# Patient Record
Sex: Female | Born: 1960 | Race: White | Hispanic: No | Marital: Married | Smoking: Never smoker
Health system: Southern US, Community
[De-identification: ages and names within clinical notes are randomized; demographics above are authoritative.]

## PROBLEM LIST (undated history)

## (undated) DIAGNOSIS — Z9889 Other specified postprocedural states: Secondary | ICD-10-CM

## (undated) DIAGNOSIS — I341 Nonrheumatic mitral (valve) prolapse: Secondary | ICD-10-CM

## (undated) DIAGNOSIS — R112 Nausea with vomiting, unspecified: Secondary | ICD-10-CM

## (undated) DIAGNOSIS — K449 Diaphragmatic hernia without obstruction or gangrene: Secondary | ICD-10-CM

## (undated) DIAGNOSIS — R002 Palpitations: Secondary | ICD-10-CM

## (undated) DIAGNOSIS — R079 Chest pain, unspecified: Secondary | ICD-10-CM

## (undated) HISTORY — DX: Diaphragmatic hernia without obstruction or gangrene: K44.9

## (undated) HISTORY — PX: WISDOM TOOTH EXTRACTION: SHX21

## (undated) HISTORY — DX: Palpitations: R00.2

## (undated) HISTORY — PX: OTHER SURGICAL HISTORY: SHX169

## (undated) HISTORY — DX: Chest pain, unspecified: R07.9

---

## 1995-09-23 HISTORY — PX: DIAGNOSTIC LAPAROSCOPY: SUR761

## 1998-06-11 ENCOUNTER — Inpatient Hospital Stay (HOSPITAL_COMMUNITY): Admission: AD | Admit: 1998-06-11 | Discharge: 1998-06-13 | Payer: Self-pay | Admitting: Obstetrics and Gynecology

## 1998-06-15 ENCOUNTER — Inpatient Hospital Stay (HOSPITAL_COMMUNITY): Admission: AD | Admit: 1998-06-15 | Discharge: 1998-06-15 | Payer: Self-pay | Admitting: Obstetrics and Gynecology

## 1998-06-17 ENCOUNTER — Inpatient Hospital Stay (HOSPITAL_COMMUNITY): Admission: AD | Admit: 1998-06-17 | Discharge: 1998-07-03 | Payer: Self-pay | Admitting: *Deleted

## 1998-10-22 ENCOUNTER — Ambulatory Visit (HOSPITAL_COMMUNITY): Admission: RE | Admit: 1998-10-22 | Discharge: 1998-10-22 | Payer: Self-pay | Admitting: Gastroenterology

## 1999-10-15 ENCOUNTER — Ambulatory Visit (HOSPITAL_COMMUNITY): Admission: RE | Admit: 1999-10-15 | Discharge: 1999-10-15 | Payer: Self-pay

## 1999-10-15 ENCOUNTER — Encounter (INDEPENDENT_AMBULATORY_CARE_PROVIDER_SITE_OTHER): Payer: Self-pay

## 2001-07-29 ENCOUNTER — Other Ambulatory Visit: Admission: RE | Admit: 2001-07-29 | Discharge: 2001-07-29 | Payer: Self-pay | Admitting: Obstetrics and Gynecology

## 2002-08-24 ENCOUNTER — Other Ambulatory Visit: Admission: RE | Admit: 2002-08-24 | Discharge: 2002-08-24 | Payer: Self-pay | Admitting: Obstetrics and Gynecology

## 2003-09-06 ENCOUNTER — Other Ambulatory Visit: Admission: RE | Admit: 2003-09-06 | Discharge: 2003-09-06 | Payer: Self-pay | Admitting: Obstetrics and Gynecology

## 2004-07-22 ENCOUNTER — Ambulatory Visit (HOSPITAL_COMMUNITY): Admission: RE | Admit: 2004-07-22 | Discharge: 2004-07-22 | Payer: Self-pay | Admitting: Gastroenterology

## 2004-09-27 ENCOUNTER — Other Ambulatory Visit: Admission: RE | Admit: 2004-09-27 | Discharge: 2004-09-27 | Payer: Self-pay | Admitting: Obstetrics and Gynecology

## 2005-11-14 ENCOUNTER — Other Ambulatory Visit: Admission: RE | Admit: 2005-11-14 | Discharge: 2005-11-14 | Payer: Self-pay | Admitting: Obstetrics and Gynecology

## 2006-01-21 ENCOUNTER — Encounter: Payer: Self-pay | Admitting: Obstetrics & Gynecology

## 2007-09-23 HISTORY — PX: TUBAL LIGATION: SHX77

## 2007-09-23 HISTORY — PX: DILATION AND CURETTAGE OF UTERUS: SHX78

## 2008-04-04 ENCOUNTER — Encounter (INDEPENDENT_AMBULATORY_CARE_PROVIDER_SITE_OTHER): Payer: Self-pay | Admitting: Obstetrics and Gynecology

## 2008-04-04 ENCOUNTER — Ambulatory Visit (HOSPITAL_COMMUNITY): Admission: RE | Admit: 2008-04-04 | Discharge: 2008-04-04 | Payer: Self-pay | Admitting: Obstetrics and Gynecology

## 2010-04-02 ENCOUNTER — Encounter: Admission: RE | Admit: 2010-04-02 | Discharge: 2010-04-02 | Payer: Self-pay | Admitting: Otolaryngology

## 2011-02-04 NOTE — Op Note (Signed)
NAMEDESTANEY, SARKIS NO.:  [id number]   MEDICAL RECORD NO.:  [id number]          PATIENT TYPE:  AMB   LOCATION:  SDC                           FACILITY:  [facility]   PHYSICIAN:  Juluis Mire, M.D.   DATE OF BIRTH:  1960/12/11   DATE OF PROCEDURE:  DATE OF DISCHARGE:                               OPERATIVE REPORT   PREOPERATIVE DIAGNOSES:  Endometrial polyp.  Desires sterility.   POSTOPERATIVE DIAGNOSES:  Endometrial polyp.  Desires sterility.   OPERATIVE PROCEDURE:  Paracervical block, cervical dilation,  hysteroscopy, resection of polyp, as well as endometrial biopsies and  curettings, and diagnostic laparoscopy with bilateral tubal fulguration.   SURGEON:  Juluis Mire, MD   ANESTHESIA:  General endotracheal.   ESTIMATED BLOOD LOSS:  Minimal.   PACKS AND DRAINS:  None.   INTRAOPERATIVE BLOOD PLACED:  None.   COMPLICATIONS:  None.   INDICATIONS:  In dictated history and physical.   PROCEDURE:  The patient was taken to the OR, placed supine position.  After satisfactory level of general endotracheal anesthesia was  obtained, the patient was placed in the dorsal lithotomy position using  the Allen stirrups.  The abdomen, perineum, and vagina prepped out with  Betadine.  Bladder was in-and-out catheterization.  The patient was then  draped out for hysteroscopy.  A speculum was placed in the vaginal  vault.  Cervix was grasped Christella Hartigan tenaculum.  Uterus sounded to  approximately 9 cm.  A paracervical block of 1% Nesacaine was  instituted.  Cervix serially dilated with size 29 Pratt dilator.  Operative hysteroscope was then introduced.  Intrauterine cavity was  distended using sorbitol.  She did have a polyp near the right tubal  ostium.  It was resected and sent for pathology.  The endometrium was  otherwise atrophic.  It looked she had a posterior wall fibroid that was  slightly submucosal.  Biopsies were obtained of the endometrial  curettings.  Total  deficit was 150 mL.  There were no signs of  perforation or active bleeding.  At this point in time, hysteroscopy was  discontinued.  The single-tooth tenaculum and speculum were then  removed.  A Hulka sac was put in place.  Bladder was again emptied out  by catheterization.   At this point time, a subumbilical incision was made with a knife.  The  incision was extended through subcutaneous tissue.  Fascia was  identified, entered sharply, incision fashioned laterally.  Muscles were  separated.  The peritoneum was elevated entered sharply.  An open  laparoscopic trocar was put in place and secured.  Abdomen was inflated  with carbon dioxide.  Laparoscope was introduced.  The patient was  placed in the Trendelenburg position.  Visualization revealed a  posterior wall uterine fibroid measuring approximately 5 cm.  Tubes and  ovaries were unremarkable.  Mid segment of each tube was identified and  cauterized for a distance of 2-3 cm.  Coagulation was continued until  resistance read 0.  We then coagulated the same segment of tube  completely desiccating the tube.  Coagulation did extend  out to  mesosalpinx.  At this point on time, both tubes appeared to have been  adequately coagulated.  Again, ovaries were unremarkable.  Appendix was  visualized and noted to be clear.  Upper abdomen including liver and tip  of the gallbladder were unremarkable.  Abdomen was deflated with carbon  dioxide.  Trocar was removed.  Subumbilical fascia closed with figure-of-  eight of 0 Vicryl.  Skin with interrupted 4-0 Vicryl.  The Hulka tacked  was then taken out.  The patient taken out of dorsal position.  Once  alert and extubated, transferred to recovery room in good condition.  Sponge, instrument, and needle count was correct by circulating nurse.      Juluis Mire, M.D.  Electronically Signed     JSM/MEDQ  D:  04/04/2008  T:  04/05/2008  Job:  [id number]

## 2011-02-04 NOTE — H&P (Signed)
[name], [name] NO.:  [id number]   MEDICAL RECORD NO.:  [id number]          PATIENT TYPE:  AMB   LOCATION:  SDC                           [facility]ACILITY:  [facility]   PHYSICIAN:  Juluis Mire, M.D.   DATE OF BIRTH:  02-12-1961   DATE OF ADMISSION:  04/04/2008  DATE OF DISCHARGE:                              HISTORY & PHYSICAL   The patient is 50 year old gravida 2 para 0-1-1-0 female who presents  for hysteroscopy as well as laparoscopic bilateral tubal ligation.  The  patient has a longstanding history of infertility.  She did lose twins  during the second trimester, did have a D&C.  She has had one previous  diagnostic laparoscopy performed for endometriosis.  We did a saline  infusion ultrasound with an abnormal bleeding.  She had an evidence of  an intrauterine fibroid as well as an endometrial polyp.  We are going  to resect the polyp.  She desires a permanent sterilization at this  point in time.  We discussed 2 options, one will be the Esher versus the  laparoscopic tubal.  The patient is desirous of having a laparoscopic  tubal done at this point in time.  She does understand the potential  reversibility of sterilization, failure rate of 1 in 200, failures can  be in the form of ectopic pregnancy requiring further surgical  management.   ALLERGIES:  The patient has no known drug allergies.   MEDICATIONS:  None.   PAST MEDICAL HISTORY:  Usual childhood diseases, longstanding history of  infertility, conceived with IVF, and previous laparoscopy in 1997.   FAMILY HISTORY:  Noncontributory.   SOCIAL HISTORY:  No tobacco or alcohol use.   REVIEW OF SYSTEMS:  Noncontributory.   OBJECTIVE:  GENERAL:  The patient is afebrile with stable vital signs.  HEENT:  Normocephalic.  Pupils equal, round, and reactive to light and  accommodation.  Extraocular movements were intact.  Sclerae and  conjunctiva were clear.  Oropharynx clear.  NECK:  Without thyromegaly.  BREASTS:  No discrete masses.  LUNGS:  Clear.  CARDIOVASCULAR:  Regular rhythm without murmurs or gallops.  ABDOMEN:  Benign.  No mass, splenomegaly or tenderness.  PELVIC:  Normal external genitalia.  Vaginal mucosa is clear.  Cervix  unremarkable.  Uterus normal size, shape, and contour.  Adnexa free of  mass or tenderness.  EXTREMITIES:  Trace edema.  NEURO:  Grossly within normal limits.   IMPRESSION:  1. Desires sterility.  2. Abnormal bleed with endometrial polyp.   PLAN:  The patient will undergo hysteroscopic resection of polyp,  subsequent laparoscopic tubal, the risks of surgery have been discussed  including the risk of infection.  The risk of hemorrhage that could  require transfusion with the risk of AIDS or hepatitis.  The risk of  injury to adjacent organs including bladder, bowel, ureters that could  require further exploratory surgery.  Risk of deep venous thrombosis and  pulmonary emboli.  The patient expressed understanding of the  indications and potential risk.      Juluis Mire, M.D.  Electronically Signed  JSM/MEDQ  D:  04/04/2008  T:  04/04/2008  Job:  [id number]

## 2011-02-07 NOTE — Op Note (Signed)
Christine [name], Christine [name] NO.:  [id number]   MEDICAL RECORD NO.:  [id number]          PATIENT TYPE:  AMB   LOCATION:  ENDO                         FACILITY:  [facility]   PHYSICIAN:  Bernette Redbird, M.D.   DATE OF BIRTH:  25-Mar-1961   DATE OF PROCEDURE:  07/22/2004  DATE OF DISCHARGE:                                 OPERATIVE REPORT   PROCEDURE:  Colonoscopy.   INDICATION:  Family history of colon cancer in her father with a negative  screening colonoscopy by me about four-and-a-half years ago.   FINDINGS:  Normal exam to the terminal ileum.   DESCRIPTION OF PROCEDURE:  The nature, purpose and risks of the procedure  were familiar to the patient from prior examinations and she provided  written consent.  Sedation was fentanyl 85 mcg and Versed 8.5 mg IV without  arrhythmias or desaturation.  The Olympus adjustable tension pediatric video  colonoscope was advanced without significant difficulty to the terminal  ileum, which had a normal appearance and pullback was then performed.  The  quality of the prep was excellent and I felt that all areas were well seen.   This was a normal examination.  No polyps, cancer, colitis, vascular  malformations or diverticular disease were observed.  Retroflexion in the  rectum was attempted, but somehow did not visualize the distal rectum,  apparently because the tip of the scope was getting behind folds.  Antegrade  viewing and reinspection of the rectum, however, disclosed no abnormalities.   The patient tolerated the procedure well and there were no apparent  complications.  No biopsies were obtained.   IMPRESSION:  Family history of colon cancer with normal current screening  examination.   PLAN:  Followup colonoscopy in five years.       RB/MEDQ  D:  07/22/2004  T:  07/22/2004  Job:  [id number]   cc:   Gwen Pounds, MD  Fax: [phone]

## 2011-02-07 NOTE — Op Note (Signed)
[facility]  Patient:    Christine [name]                         MRN: [id number] Proc. Date: 10/15/99 Adm. Date:  [id number] Attending:  Frederich Balding                           Operative Report  PREOPERATIVE DIAGNOSIS:       Nonviable first trimester pregnancy.  POSTOPERATIVE DIAGNOSIS:      Nonviable first trimester pregnancy.  OPERATION:                    Dilatation and evacuation.  SURGEON:                      Juluis Mire, M.D.  ANESTHESIA:                   Sedation with paracervical block.  ESTIMATED BLOOD LOSS:         Minimal.  _________ were 95.  BLOOD REPLACED:               None.  COMPLICATIONS:                None.  INDICATIONS:                  Dictated history and physical.  DESCRIPTION OF PROCEDURE:     Patient was taken to the OR and placed in the supine position.   After satisfactory level of sedation patient was placed in the dorsal lithotomy position, now in stirrups.  Speculum was placed in the vaginal vault.  Cervix was cleansed out with Betadine.  Paracervical block was entered using 1%  Xylocaine.  Cervix was _________ with single tooth tenaculum.  The uterus was found at approximately 10 cm.  The cervix was easily dilated to a size 27 Pratt  dilator.  A size 8 curved suction curet was introduced and contents were removed using suction. This was continued until no additional tissue was obtained.  The  uterus was contracting down well.  Sharp curetting revealed all quadrants to be  clear with no additional tissue.  Repeat suction curetting revealed the uterus o be contracting nicely with no additional tissue.  The speculum and single tooth  tenaculum were removed.  The patient was taken out of the dorsal lithotomy position, was alert, and was transferred to the recovery room in good condition. Sponge, instrument, and needle count were correct.  _________ . DD:  10/15/99 TD:  10/15/99 Job:  [id number]

## 2011-02-07 NOTE — H&P (Signed)
[facility]  PatientALLINA [name]                         MRN: [id number] Adm. Date:  [id number] Disc. Date: [id number] Attending:  Frederich Balding                         History and Physical  HISTORY OF PRESENT ILLNESS:   Patient is a 50 year old gravida 2, para 0-1-0-0,  married white female who presents for a D&E for a nonviable first trimester of pregnancy.  The patient did conceive with IVF.  She has been followed at [facility] since the conception.  It is evident by their scans that this is a nonviable pregnancy. he presented to the office yesterday to repeat ultrasound; there was a gestational sac consistent with 7 weeks and 4 days.  No fetal pole or heart activity were noted. In view of these findings, the patient presents now at the present time to undergo D&E for nonviable first trimester of pregnancy.  ALLERGIES:                    No known drug allergies.  MEDICATIONS:                  Vitamins.  PAST MEDICAL HISTORY:         Usual childhood diseases; no significant sequelae.  PREVIOUS SURGICAL HISTORY:    Diagnostic laparoscopy in February 1997 with finding of minimal pelvic endometriosis.  She does have a questionable history of mitral valve prolapse.  FAMILY HISTORY:               Noncontributory.  SOCIAL HISTORY:               No tobacco or alcohol use.  REVIEW OF SYSTEMS:            Noncontributory.  PHYSICAL EXAMINATION:         Patient is afebrile with stable vital signs.  HEENT:                        Patient is normocephalic.  Pupils are equal, round and reactive to light and accommodation.  Extraocular movements are intact. Sclerae and conjunctivae clear.  Oropharynx clear.  NECK:                         Without thyromegaly.  BREASTS:                      No discrete masses.  LUNGS:                        Clear.  CARDIAC:                      Regular rhythm and rate.  Without murmurs  or gallops.  ABDOMEN:                      Exam is benign.  PELVIC:                       Normal external genitalia.  Vaginal mucosa is clear. Cervix unremarkable.  Uterus feels to be upper limits of normal size.  Adnexa unremarkable.  EXTREMITIES:  Trace edema.  NEUROLOGIC:                   Grossly within normal limits.  IMPRESSION:                   Nonviable first trimester of pregnancy.  PLAN:                         The patient will undergo dilatation and evacuation. The risks have been discussed including anesthetic concerns, the risk of infection, the risk of continued bleeding or hemorrhage that could require transfusion with the risks of AIDS or hepatitis, and excessive bleeding could require hysterectomy, there is a risk of uterine perforation that could require diagnostic laparoscopy and exploratory laparotomy for management and the risks of deep venous thrombosis and pulmonary embolus.  Patient expressed understanding of indications and risks. Patients blood type is O-positive.  RhoGAM will not be required. DD:  10/15/99 TD:  10/15/99 Job: [id number]

## 2011-06-19 LAB — CBC
HCT: 33.4 — ABNORMAL LOW
Hemoglobin: 11.3 — ABNORMAL LOW
MCHC: 34
MCV: 87.8
Platelets: 206
RBC: 3.8 — ABNORMAL LOW
RDW: 14.6
WBC: 5.7

## 2011-07-03 ENCOUNTER — Other Ambulatory Visit (HOSPITAL_COMMUNITY): Payer: Self-pay | Admitting: Obstetrics and Gynecology

## 2011-07-04 ENCOUNTER — Ambulatory Visit (HOSPITAL_COMMUNITY)
Admission: RE | Admit: 2011-07-04 | Discharge: 2011-07-04 | Disposition: A | Discharge status: Home / Self Care | Payer: BC Managed Care – PPO | Source: Ambulatory Visit | Attending: Obstetrics and Gynecology | Admitting: Obstetrics and Gynecology

## 2011-07-04 DIAGNOSIS — K7689 Other specified diseases of liver: Secondary | ICD-10-CM | POA: Insufficient documentation

## 2011-07-04 DIAGNOSIS — R109 Unspecified abdominal pain: Secondary | ICD-10-CM | POA: Insufficient documentation

## 2011-11-19 ENCOUNTER — Other Ambulatory Visit (HOSPITAL_COMMUNITY): Payer: Self-pay | Admitting: Obstetrics and Gynecology

## 2011-11-19 DIAGNOSIS — K7689 Other specified diseases of liver: Secondary | ICD-10-CM

## 2011-11-24 ENCOUNTER — Other Ambulatory Visit: Payer: Self-pay | Admitting: Dermatology

## 2012-01-05 ENCOUNTER — Ambulatory Visit (HOSPITAL_COMMUNITY)
Admission: RE | Admit: 2012-01-05 | Discharge: 2012-01-05 | Disposition: A | Discharge status: Home / Self Care | Payer: BC Managed Care – PPO | Source: Ambulatory Visit | Attending: Obstetrics and Gynecology | Admitting: Obstetrics and Gynecology

## 2012-01-05 DIAGNOSIS — K7689 Other specified diseases of liver: Secondary | ICD-10-CM | POA: Insufficient documentation

## 2012-01-07 ENCOUNTER — Ambulatory Visit (HOSPITAL_COMMUNITY): Payer: BC Managed Care – PPO

## 2013-01-27 ENCOUNTER — Encounter (HOSPITAL_COMMUNITY): Payer: Self-pay | Admitting: *Deleted

## 2013-01-28 ENCOUNTER — Encounter (HOSPITAL_COMMUNITY): Payer: Self-pay

## 2013-02-03 NOTE — H&P (Signed)
  Patient name  Christine [name], Christine [name] DICTATION#  [id number] CSN# [id number]  [facility], MD 02/03/2013 10:21 AM

## 2013-02-04 NOTE — H&P (Signed)
NAMECHANDLAR, [name] NO.:  [id number]  MEDICAL RECORD NO.:  [id number]  LOCATION:                                 FACILITY:  PHYSICIAN:  Juluis Mire, M.D.        DATE OF BIRTH:  DATE OF ADMISSION: DATE OF DISCHARGE:                             HISTORY & PHYSICAL   HISTORY OF PRESENT ILLNESS:  The patient is a 52 year old gravida 2, para 0, abortus 2 female, who presents for hysteroscopy with D and C.  In relation to present admission, she was having abnormal uterine bleeding and underwent saline infusion ultrasound revealed 2 endometrial polyps and now presents for hysteroscopic resection.  ALLERGIES:  No known drug allergies.  MEDICATIONS:  None at the present time.  PAST MEDICAL HISTORY:  She has had usual childhood diseases without any significant sequelae.  She has a history of chronic tinnitus.  This has been followed by an Ear, Nose, and Throat doctor.  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  PAST SURGICAL HISTORY:  She has had D and C.  In 1999, she conceived with twins, lost 1 twin and subsequently had previable delivery the 2nd twin.  In 1997, she had laparoscopic evaluation with treatment of endometriosis.  In 2007, she had hysteroscopy with removal of the polyp and bilateral tubal ligation and was found to have fibroids at that time.  She does have 1 adopted child.  SOCIAL HISTORY:  Reveals no tobacco or alcohol use.  FAMILY HISTORY:  Noncontributory.  REVIEW OF SYSTEMS:  Noncontributory.  PHYSICAL EXAMINATION:  VITAL SIGNS:  The patient is afebrile.  Stable vital signs. HEENT:  The patient is normocephalic.  Pupils equal, round, and reactive to light and accommodation.  Extraocular movements were intact.  Sclerae and conjunctivae are clear.  Oropharynx clear. NECK:  Without thyromegaly. BREASTS:  No discrete masses. LUNGS:  Clear. CARDIOVASCULAR:  Regular rhythm rate without murmurs or gallops.  There was no  carotid or abdominal bruits. ABDOMEN:  Benign.  No mass, organomegaly, or tenderness. PELVIC:  Normal external genitalia.  Vaginal mucosa is clear.  Cervix unremarkable.  Uterus, upper limits of normal size, irregular consistent with known fibroids.  Adnexa unremarkable. EXTREMITIES:  Trace edema. NEUROLOGIC:  Gross normal limits.  IMPRESSION: 1. Perimenopausal, abnormal bleeding with endometrial polyps. 2. Uterine fibroids.  PLAN:  The patient to undergo hysteroscopy with D and C.  The nature of procedure have been discussed.  The risk have been explained.  The risk would include the risk of infection.  The risk of hemorrhage that could require transfusion with the risk of AIDS or hepatitis.  Excessive bleeding could require hysterectomy.  Risk of perforation with injury to adjacent organs requiring further exploratory surgery.  Risk of deep venous thrombosis and pulmonary embolus.  The possibility of recurrent polyps have been discussed.     Juluis Mire, M.D.     JSM/MEDQ  D:  02/03/2013  T:  02/04/2013  Job:  [id number]

## 2013-02-10 ENCOUNTER — Ambulatory Visit (HOSPITAL_COMMUNITY)
Admission: RE | Admit: 2013-02-10 | Discharge: 2013-02-10 | Disposition: A | Discharge status: Home / Self Care | Payer: PRIVATE HEALTH INSURANCE | Source: Ambulatory Visit | Attending: Obstetrics and Gynecology | Admitting: Obstetrics and Gynecology

## 2013-02-10 ENCOUNTER — Ambulatory Visit (HOSPITAL_COMMUNITY): Payer: PRIVATE HEALTH INSURANCE | Admitting: Anesthesiology

## 2013-02-10 ENCOUNTER — Encounter (HOSPITAL_COMMUNITY): Payer: Self-pay | Admitting: Anesthesiology

## 2013-02-10 ENCOUNTER — Encounter (HOSPITAL_COMMUNITY)
Admission: RE | Disposition: A | Discharge status: Home / Self Care | Payer: Self-pay | Source: Ambulatory Visit | Attending: Obstetrics and Gynecology

## 2013-02-10 DIAGNOSIS — N841 Polyp of cervix uteri: Secondary | ICD-10-CM | POA: Insufficient documentation

## 2013-02-10 DIAGNOSIS — N840 Polyp of corpus uteri: Secondary | ICD-10-CM | POA: Diagnosis present

## 2013-02-10 DIAGNOSIS — N949 Unspecified condition associated with female genital organs and menstrual cycle: Secondary | ICD-10-CM | POA: Insufficient documentation

## 2013-02-10 DIAGNOSIS — N938 Other specified abnormal uterine and vaginal bleeding: Secondary | ICD-10-CM | POA: Insufficient documentation

## 2013-02-10 HISTORY — DX: Nausea with vomiting, unspecified: R11.2

## 2013-02-10 HISTORY — PX: DILATATION & CURRETTAGE/HYSTEROSCOPY WITH RESECTOCOPE: SHX5572

## 2013-02-10 HISTORY — DX: Other specified postprocedural states: Z98.890

## 2013-02-10 HISTORY — DX: Nonrheumatic mitral (valve) prolapse: I34.1

## 2013-02-10 LAB — CBC
HCT: 41.1 % (ref 36.0–46.0)
MCH: 30.5 pg (ref 26.0–34.0)
MCHC: 33.8 g/dL (ref 30.0–36.0)
MCV: 90.3 fL (ref 78.0–100.0)
RDW: 13.1 % (ref 11.5–15.5)

## 2013-02-10 SURGERY — DILATATION & CURETTAGE/HYSTEROSCOPY WITH RESECTOCOPE
Anesthesia: General | Site: Vagina | Wound class: Clean Contaminated

## 2013-02-10 MED ORDER — ONDANSETRON HCL 4 MG/2ML IJ SOLN
INTRAMUSCULAR | Status: AC
  Filled 2013-02-10: qty 2

## 2013-02-10 MED ORDER — PROMETHAZINE HCL 25 MG/ML IJ SOLN: 6.2500 mg | INTRAMUSCULAR | Status: DC | PRN

## 2013-02-10 MED ORDER — SCOPOLAMINE 1 MG/3DAYS TD PT72: 1.0000 | MEDICATED_PATCH | TRANSDERMAL | Status: DC

## 2013-02-10 MED ORDER — KETOROLAC TROMETHAMINE 30 MG/ML IJ SOLN: 15.0000 mg | Freq: Once | INTRAMUSCULAR | Status: DC | PRN

## 2013-02-10 MED ORDER — SCOPOLAMINE 1 MG/3DAYS TD PT72
MEDICATED_PATCH | TRANSDERMAL | Status: AC
  Administered 2013-02-10: 1.5 mg via TRANSDERMAL
  Filled 2013-02-10: qty 1

## 2013-02-10 MED ORDER — PROPOFOL 10 MG/ML IV BOLUS
INTRAVENOUS | Status: DC | PRN
  Administered 2013-02-10: 200 mg via INTRAVENOUS

## 2013-02-10 MED ORDER — OXYCODONE-ACETAMINOPHEN 7.5-325 MG PO TABS: 1.0000 | ORAL_TABLET | ORAL | Status: DC | PRN

## 2013-02-10 MED ORDER — MIDAZOLAM HCL 2 MG/2ML IJ SOLN
INTRAMUSCULAR | Status: AC
  Filled 2013-02-10: qty 2

## 2013-02-10 MED ORDER — LIDOCAINE-EPINEPHRINE (PF) 1 %-1:200000 IJ SOLN
INTRAMUSCULAR | Status: AC
  Filled 2013-02-10: qty 10

## 2013-02-10 MED ORDER — GLYCINE 1.5 % IR SOLN
Status: DC | PRN
  Administered 2013-02-10: 3000 mL

## 2013-02-10 MED ORDER — LACTATED RINGERS IV SOLN
INTRAVENOUS | Status: DC
  Administered 2013-02-10: 07:00:00 via INTRAVENOUS

## 2013-02-10 MED ORDER — FENTANYL CITRATE 0.05 MG/ML IJ SOLN
INTRAMUSCULAR | Status: AC
  Filled 2013-02-10: qty 2

## 2013-02-10 MED ORDER — MIDAZOLAM HCL 2 MG/2ML IJ SOLN: 0.5000 mg | Freq: Once | INTRAMUSCULAR | Status: DC | PRN

## 2013-02-10 MED ORDER — GLYCOPYRROLATE 0.2 MG/ML IJ SOLN
INTRAMUSCULAR | Status: AC
  Filled 2013-02-10: qty 1

## 2013-02-10 MED ORDER — DEXAMETHASONE SODIUM PHOSPHATE 4 MG/ML IJ SOLN
INTRAMUSCULAR | Status: DC | PRN
  Administered 2013-02-10: 10 mg via INTRAVENOUS

## 2013-02-10 MED ORDER — DEXAMETHASONE SODIUM PHOSPHATE 10 MG/ML IJ SOLN
INTRAMUSCULAR | Status: AC
  Filled 2013-02-10: qty 1

## 2013-02-10 MED ORDER — MEPERIDINE HCL 25 MG/ML IJ SOLN: 6.2500 mg | INTRAMUSCULAR | Status: DC | PRN

## 2013-02-10 MED ORDER — FENTANYL CITRATE 0.05 MG/ML IJ SOLN
INTRAMUSCULAR | Status: DC | PRN
  Administered 2013-02-10: 100 ug via INTRAVENOUS

## 2013-02-10 MED ORDER — MIDAZOLAM HCL 5 MG/5ML IJ SOLN
INTRAMUSCULAR | Status: DC | PRN
  Administered 2013-02-10: 2 mg via INTRAVENOUS

## 2013-02-10 MED ORDER — CEFAZOLIN SODIUM-DEXTROSE 2-3 GM-% IV SOLR
INTRAVENOUS | Status: AC
  Filled 2013-02-10: qty 50

## 2013-02-10 MED ORDER — LIDOCAINE HCL (CARDIAC) 20 MG/ML IV SOLN
INTRAVENOUS | Status: AC
  Filled 2013-02-10: qty 5

## 2013-02-10 MED ORDER — LIDOCAINE-EPINEPHRINE (PF) 1 %-1:200000 IJ SOLN
INTRAMUSCULAR | Status: DC | PRN
  Administered 2013-02-10: 20 mL

## 2013-02-10 MED ORDER — CEFAZOLIN SODIUM-DEXTROSE 2-3 GM-% IV SOLR
2.0000 g | INTRAVENOUS | Status: AC
  Administered 2013-02-10: 2 g via INTRAVENOUS

## 2013-02-10 MED ORDER — ONDANSETRON HCL 4 MG/2ML IJ SOLN
INTRAMUSCULAR | Status: DC | PRN
  Administered 2013-02-10: 4 mg via INTRAVENOUS

## 2013-02-10 MED ORDER — PROPOFOL 10 MG/ML IV EMUL
INTRAVENOUS | Status: AC
  Filled 2013-02-10: qty 20

## 2013-02-10 MED ORDER — LIDOCAINE HCL (CARDIAC) 20 MG/ML IV SOLN
INTRAVENOUS | Status: DC | PRN
  Administered 2013-02-10: 60 mg via INTRAVENOUS

## 2013-02-10 MED ORDER — FENTANYL CITRATE 0.05 MG/ML IJ SOLN: 25.0000 ug | INTRAMUSCULAR | Status: DC | PRN

## 2013-02-10 SURGICAL SUPPLY — 16 items
CANISTER SUCTION 2500CC (MISCELLANEOUS) ×2 IMPLANT
CATH ROBINSON RED A/P 16FR (CATHETERS) ×2 IMPLANT
CLOTH BEACON ORANGE TIMEOUT ST (SAFETY) ×2 IMPLANT
CONTAINER PREFILL 10% NBF 60ML (FORM) ×6 IMPLANT
DRESSING TELFA 8X3 (GAUZE/BANDAGES/DRESSINGS) ×2 IMPLANT
ELECT REM PT RETURN 9FT ADLT (ELECTROSURGICAL) ×2
ELECTRODE REM PT RTRN 9FT ADLT (ELECTROSURGICAL) ×1 IMPLANT
GLOVE BIO SURGEON STRL SZ7 (GLOVE) ×2 IMPLANT
GOWN STRL REIN XL XLG (GOWN DISPOSABLE) ×4 IMPLANT
LOOP ANGLED CUTTING 22FR (CUTTING LOOP) ×2 IMPLANT
NDL SPNL 20GX3.5 QUINCKE YW (NEEDLE) ×1 IMPLANT
NEEDLE SPNL 20GX3.5 QUINCKE YW (NEEDLE) ×2 IMPLANT
PACK HYSTEROSCOPY LF (CUSTOM PROCEDURE TRAY) ×2 IMPLANT
PAD OB MATERNITY 4.3X12.25 (PERSONAL CARE ITEMS) ×2 IMPLANT
TOWEL OR 17X24 6PK STRL BLUE (TOWEL DISPOSABLE) ×4 IMPLANT
WATER STERILE IRR 1000ML POUR (IV SOLUTION) ×2 IMPLANT

## 2013-02-10 NOTE — Brief Op Note (Signed)
02/10/2013  8:01 AM  PATIENT:  Christine [name]  52 y.o. female  PRE-OPERATIVE DIAGNOSIS:  polyp cpt [phone]  POST-OPERATIVE DIAGNOSIS:  * No post-op diagnosis entered *  PROCEDURE:  Procedure(s): DILATATION & CURETTAGE/HYSTEROSCOPY WITH RESECTOCOPE (N/A)  SURGEON:  Surgeon(s) and Role:    * Juluis Mire, MD - Primary  PHYSICIAN ASSISTANT:   ASSISTANTS: none   ANESTHESIA:   general and paracervical block  EBL:     BLOOD ADMINISTERED:none  DRAINS: none   LOCAL MEDICATIONS USED:  XYLOCAINE with epi  and Amount: 20 ml  SPECIMEN:  Source of Specimen:  ednocervical polyp  endometrial resections and curretting  DISPOSITION OF SPECIMEN:  PATHOLOGY  COUNTS:  yes  TOURNIQUET:  * No tourniquets in log *  DICTATION: .Other Dictation: Dictation Number [id number]  PLAN OF CARE: Discharge to home after PACU  PATIENT DISPOSITION:  PACU - hemodynamically stable.   Delay start of Pharmacological VTE agent (>24hrs) due to surgical blood loss or risk of bleeding: not applicable

## 2013-02-10 NOTE — Op Note (Signed)
Patient name  Christine [name], Christine [name] DICTATION#  [id number] CSN# [id number]  [facility], MD 02/10/2013 8:03 AM

## 2013-02-10 NOTE — Op Note (Signed)
Christine [name], Christine [name] NO.:  [id number]  MEDICAL RECORD NO.:  [id number]  LOCATION:  [facility]                          FACILITY:  [facility]  PHYSICIAN:  Juluis Mire, M.D.   DATE OF BIRTH:  1960/10/04  DATE OF PROCEDURE:  02/10/2013 DATE OF DISCHARGE:  02/10/2013                              OPERATIVE REPORT   PREOPERATIVE DIAGNOSIS:  Endometrial polyp.  POSTOPERATIVE DIAGNOSIS:  Endometrial polyp in addition with an endocervical polyp.  OPERATIVE PROCEDURE:  Dilation and curettage, hysteroscopy, resection of endometrial polyps.  Removal of endocervical polyp.  Paracervical block.  ANESTHESIA:  General with paracervical block.  ESTIMATED BLOOD LOSS:  Minimal.  PACKS AND DRAINS:  None.  INTRAOPERATIVE BLOOD PLACED:  None.  COMPLICATIONS:  None.  INDICATION:  Dictated in history and physical.  PROCEDURE:  The patient was taken to the OR and placed in supine position.  After satisfactory level of general anesthesia obtained, the patient was placed in dorsal lithotomy position using the Allen stirrups.  The perineum and vagina were prepped out with Betadine and draped as a sterile field.  A speculum was placed in the vaginal vault. The cervix was grasped with single-tooth tenaculum.  A 20 mL of 1% Xylocaine with epinephrine were used to setup the paracervical block. Uterus was sounded to approximately 9 cm.  There was an endocervical polyp that was removed and sent for pathology.  Cervix was serially dilated to a size 33 Pratt dilator.  Operative hysteroscope was then introduced.  Intrauterine cavity was distended using glycine. Visualization was revealed two posterior wall polyp-like outburst. These were resected along with the surrounding endometrium, this was all sent for pathology.  Subsequently, the hysteroscope was removed, the endometrial curettings were then obtained.  Minimal tissue was obtained at this point in time.  Total deficit was 85 mL.  There was  no signs of perforation and no complications. Speculum and single-tooth tenaculum were removed.  The patient was taken out of the dorsal lithotomy position.  Once alert and extubated, transferred to the recovery room in good condition.  Sponge, instrument, and needle count were reported as correct by circulating nurse.     Juluis Mire, M.D.     JSM/MEDQ  D:  02/10/2013  T:  02/10/2013  Job:  [id number]

## 2013-02-10 NOTE — Anesthesia Preprocedure Evaluation (Signed)
Anesthesia Evaluation  Patient identified by MRN, date of birth, ID band Patient awake    Reviewed: Allergy & Precautions, H&P , Patient's Chart, lab work & pertinent test results, reviewed documented beta blocker date and time   History of Anesthesia Complications (+) PONV  Airway Mallampati: II TM Distance: >3 FB Neck ROM: full    Dental no notable dental hx.    Pulmonary neg pulmonary ROS,  breath sounds clear to auscultation  Pulmonary exam normal       Cardiovascular Exercise Tolerance: Good negative cardio ROS  + Valvular Problems/Murmurs MVP Rhythm:regular Rate:Normal     Neuro/Psych negative neurological ROS  negative psych ROS   GI/Hepatic negative GI ROS, Neg liver ROS,   Endo/Other  negative endocrine ROS  Renal/GU negative Renal ROS     Musculoskeletal   Abdominal   Peds  Hematology negative hematology ROS (+)   Anesthesia Other Findings   Reproductive/Obstetrics negative OB ROS                           Anesthesia Physical Anesthesia Plan  ASA: II  Anesthesia Plan: General LMA   Post-op Pain Management:    Induction:   Airway Management Planned:   Additional Equipment:   Intra-op Plan:   Post-operative Plan:   Informed Consent: I have reviewed the patients History and Physical, chart, labs and discussed the procedure including the risks, benefits and alternatives for the proposed anesthesia with the patient or authorized representative who has indicated his/her understanding and acceptance.   Dental Advisory Given  Plan Discussed with: CRNA, Surgeon and Anesthesiologist  Anesthesia Plan Comments:         Anesthesia Quick Evaluation

## 2013-02-10 NOTE — H&P (Signed)
  History and physical exam unchanged 

## 2013-02-10 NOTE — Transfer of Care (Signed)
Immediate Anesthesia Transfer of Care Note  Patient: Christine [name]  Procedure(s) Performed: Procedure(s): DILATATION & CURETTAGE/HYSTEROSCOPY WITH RESECTOCOPE (N/A)  Patient Location: PACU  Anesthesia Type:General  Level of Consciousness: awake, alert  and oriented  Airway & Oxygen Therapy: Patient Spontanous Breathing and Patient connected to nasal cannula oxygen  Post-op Assessment: Report given to PACU RN and Post -op Vital signs reviewed and stable  Post vital signs: Reviewed and stable  Complications: No apparent anesthesia complications

## 2013-02-11 ENCOUNTER — Encounter (HOSPITAL_COMMUNITY): Payer: Self-pay | Admitting: Obstetrics and Gynecology

## 2013-02-11 NOTE — Anesthesia Postprocedure Evaluation (Signed)
  Anesthesia Post-op Note  Patient: Christine [name]  Procedure(s) Performed: Procedure(s): DILATATION & CURETTAGE/HYSTEROSCOPY WITH RESECTOCOPE (N/A)  Patient Location: PACU  Anesthesia Type:General  Level of Consciousness: awake, alert  and oriented  Airway and Oxygen Therapy: Patient Spontanous Breathing  Post-op Pain: none  Post-op Assessment: Post-op Vital signs reviewed, Patient's Cardiovascular Status Stable, Respiratory Function Stable, Patent Airway, No signs of Nausea or vomiting and Pain level controlled  Post-op Vital Signs: Reviewed and stable  Complications: No apparent anesthesia complications

## 2013-02-28 ENCOUNTER — Telehealth: Payer: Self-pay | Admitting: Neurology

## 2013-02-28 NOTE — Telephone Encounter (Signed)
Calling to make a f/u with Dr. Anne Hahn for medication management.

## 2013-03-16 ENCOUNTER — Ambulatory Visit: Payer: PRIVATE HEALTH INSURANCE | Admitting: Cardiology

## 2014-09-07 ENCOUNTER — Other Ambulatory Visit: Payer: Self-pay | Admitting: Dermatology

## 2014-09-26 ENCOUNTER — Other Ambulatory Visit (HOSPITAL_COMMUNITY): Payer: Self-pay | Admitting: Interventional Radiology

## 2014-09-26 DIAGNOSIS — H9312 Tinnitus, left ear: Secondary | ICD-10-CM

## 2014-10-10 ENCOUNTER — Ambulatory Visit (HOSPITAL_COMMUNITY)
Admission: RE | Admit: 2014-10-10 | Discharge: 2014-10-10 | Disposition: A | Discharge status: Home / Self Care | Payer: PRIVATE HEALTH INSURANCE | Source: Ambulatory Visit | Attending: Interventional Radiology | Admitting: Interventional Radiology

## 2014-10-10 DIAGNOSIS — H9312 Tinnitus, left ear: Secondary | ICD-10-CM

## 2014-10-26 ENCOUNTER — Other Ambulatory Visit (HOSPITAL_COMMUNITY): Payer: Self-pay | Admitting: Interventional Radiology

## 2014-10-26 DIAGNOSIS — H9312 Tinnitus, left ear: Secondary | ICD-10-CM

## 2014-10-26 DIAGNOSIS — H903 Sensorineural hearing loss, bilateral: Secondary | ICD-10-CM

## 2014-11-06 ENCOUNTER — Other Ambulatory Visit: Payer: Self-pay | Admitting: Radiology

## 2014-11-07 ENCOUNTER — Other Ambulatory Visit: Payer: Self-pay | Admitting: Radiology

## 2014-11-08 ENCOUNTER — Other Ambulatory Visit: Payer: Self-pay | Admitting: Radiology

## 2014-11-09 ENCOUNTER — Other Ambulatory Visit (HOSPITAL_COMMUNITY): Payer: Self-pay | Admitting: Interventional Radiology

## 2014-11-09 ENCOUNTER — Encounter (HOSPITAL_COMMUNITY): Payer: Self-pay

## 2014-11-09 ENCOUNTER — Ambulatory Visit (HOSPITAL_COMMUNITY)
Admission: RE | Admit: 2014-11-09 | Discharge: 2014-11-09 | Disposition: A | Discharge status: Home / Self Care | Payer: PRIVATE HEALTH INSURANCE | Source: Ambulatory Visit | Attending: Interventional Radiology | Admitting: Interventional Radiology

## 2014-11-09 DIAGNOSIS — H9312 Tinnitus, left ear: Secondary | ICD-10-CM | POA: Insufficient documentation

## 2014-11-09 DIAGNOSIS — H903 Sensorineural hearing loss, bilateral: Secondary | ICD-10-CM

## 2014-11-09 LAB — CBC WITH DIFFERENTIAL/PLATELET
BASOS ABS: 0 10*3/uL (ref 0.0–0.1)
Basophils Relative: 1 % (ref 0–1)
Eosinophils Absolute: 0.1 10*3/uL (ref 0.0–0.7)
Eosinophils Relative: 1 % (ref 0–5)
HCT: 41.9 % (ref 36.0–46.0)
HEMOGLOBIN: 14 g/dL (ref 12.0–15.0)
LYMPHS PCT: 18 % (ref 12–46)
Lymphs Abs: 1.2 10*3/uL (ref 0.7–4.0)
MCH: 30.7 pg (ref 26.0–34.0)
MCHC: 33.4 g/dL (ref 30.0–36.0)
MCV: 91.9 fL (ref 78.0–100.0)
MONO ABS: 0.5 10*3/uL (ref 0.1–1.0)
MONOS PCT: 7 % (ref 3–12)
NEUTROS ABS: 5.1 10*3/uL (ref 1.7–7.7)
NEUTROS PCT: 73 % (ref 43–77)
Platelets: 284 10*3/uL (ref 150–400)
RBC: 4.56 MIL/uL (ref 3.87–5.11)
RDW: 12.6 % (ref 11.5–15.5)
WBC: 6.9 10*3/uL (ref 4.0–10.5)

## 2014-11-09 LAB — PROTIME-INR
INR: 1.03 (ref 0.00–1.49)
Prothrombin Time: 13.6 seconds (ref 11.6–15.2)

## 2014-11-09 LAB — BASIC METABOLIC PANEL
Anion gap: 5 (ref 5–15)
BUN: 8 mg/dL (ref 6–23)
CO2: 28 mmol/L (ref 19–32)
CREATININE: 0.83 mg/dL (ref 0.50–1.10)
Calcium: 9.4 mg/dL (ref 8.4–10.5)
Chloride: 106 mmol/L (ref 96–112)
GFR calc Af Amer: >90 mL/min (ref >90)
GFR, EST NON AFRICAN AMERICAN: 80 mL/min — AB (ref >90)
Glucose, Bld: 104 mg/dL — ABNORMAL HIGH (ref 70–99)
POTASSIUM: 4.1 mmol/L (ref 3.5–5.1)
Sodium: 139 mmol/L (ref 135–145)

## 2014-11-09 MED ORDER — MIDAZOLAM HCL 2 MG/2ML IJ SOLN
INTRAMUSCULAR | Status: AC
  Filled 2014-11-09: qty 4

## 2014-11-09 MED ORDER — SODIUM CHLORIDE 0.9 % IV SOLN
INTRAVENOUS | Status: DC
  Administered 2014-11-09: 09:00:00 via INTRAVENOUS

## 2014-11-09 MED ORDER — HEPARIN SODIUM (PORCINE) 1000 UNIT/ML IJ SOLN
INTRAMUSCULAR | Status: AC | PRN
  Administered 2014-11-09: 1000 [IU] via INTRAVENOUS

## 2014-11-09 MED ORDER — HYDRALAZINE HCL 20 MG/ML IJ SOLN
INTRAMUSCULAR | Status: AC
  Filled 2014-11-09: qty 1

## 2014-11-09 MED ORDER — LIDOCAINE HCL 1 % IJ SOLN
INTRAMUSCULAR | Status: AC
  Filled 2014-11-09: qty 20

## 2014-11-09 MED ORDER — MIDAZOLAM HCL 2 MG/2ML IJ SOLN
INTRAMUSCULAR | Status: AC | PRN
  Administered 2014-11-09: 1 mg via INTRAVENOUS

## 2014-11-09 MED ORDER — FENTANYL CITRATE 0.05 MG/ML IJ SOLN
INTRAMUSCULAR | Status: AC
  Filled 2014-11-09: qty 4

## 2014-11-09 MED ORDER — FENTANYL CITRATE 0.05 MG/ML IJ SOLN
INTRAMUSCULAR | Status: AC | PRN
  Administered 2014-11-09: 25 ug via INTRAVENOUS

## 2014-11-09 MED ORDER — SODIUM CHLORIDE 0.9 % IV SOLN: INTRAVENOUS | Status: AC

## 2014-11-09 MED ORDER — HEPARIN SODIUM (PORCINE) 1000 UNIT/ML IJ SOLN
INTRAMUSCULAR | Status: AC
  Filled 2014-11-09: qty 1

## 2014-11-09 MED ORDER — IOHEXOL 300 MG/ML  SOLN
150.0000 mL | Freq: Once | INTRAMUSCULAR | Status: AC | PRN
  Administered 2014-11-09: 75 mL via INTRA_ARTERIAL

## 2014-11-09 MED ORDER — HEPARIN SOD (PORK) LOCK FLUSH 100 UNIT/ML IV SOLN
INTRAVENOUS | Status: AC
  Filled 2014-11-09: qty 10

## 2014-11-09 NOTE — Procedures (Signed)
S/P 4 vessel and  Lt ECA arteriogram. Rt CFa approach. Findings. 1.Angiographically no AV shunting  To suggest DAVF,or AVM or dissections. 2.No occlusions ,stenosis or filling defects noted 3. Venous outflow WNLS

## 2014-11-09 NOTE — Discharge Instructions (Signed)

## 2014-11-09 NOTE — H&P (Signed)
Chief Complaint: Left ear tinnitus  Referring Physician(s): Deveshwar,Sanjeev K  History of Present Illness: Christine [name] is a 54 y.o. 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 PONV (postoperative nausea and vomiting)   . MVP (mitral valve prolapse)     NO PROBLEMS    Past Surgical History  Procedure Laterality Date  . Tubal ligation  2009  . Diagnostic laparoscopy  1997    infertility  . Dilation and curettage of uterus  2009    mas at 64[facility]-vag del but also needed d & c  . Infertility      ivf treatment  . Wisdom tooth extraction    . Dilatation & currettage/hysteroscopy with resectocope N/A 02/10/2013    Procedure: DILATATION & CURETTAGE/HYSTEROSCOPY WITH RESECTOCOPE;  Surgeon: Juluis Mire, MD;  Location: [facility];  Service: Gynecology;  Laterality: N/A;    Allergies: Neosporin and Thimerosal  Medications: Prior to Admission medications   Medication Sig Start Date End Date Taking? Authorizing Provider  ALPRAZolam (XANAX) 0.25 MG tablet Take 0.125 mg by mouth daily as needed for anxiety or sleep.    Yes Historical Provider, MD  cetirizine (ZYRTEC) 10 MG tablet Take 10 mg by mouth at bedtime.   Yes Historical Provider, MD  Coenzyme Q10 (COQ10) 100 MG CAPS Take 100 mg by mouth daily.   Yes Historical Provider, MD  ibuprofen (ADVIL,MOTRIN) 200 MG tablet Take 400 mg by mouth 2 (two) times daily as needed for headache.    Yes Historical Provider, MD  Multiple Vitamin (MULTIVITAMIN WITH MINERALS) TABS tablet Take 1 tablet by mouth daily. Alive Women's Over 50 plus   Yes Historical Provider, MD  Polyethyl Glycol-Propyl Glycol  (SYSTANE ULTRA OP) Place 1 drop into both eyes 2 (two) times daily as needed (dry eyes).   Yes Historical Provider, MD  oxyCODONE-acetaminophen (PERCOCET) 7.5-325 MG per tablet Take 1 tablet by mouth every 4 (four) hours as needed for pain. Patient not taking: Reported on 11/06/2014 02/10/13   Juluis Mire, MD     History reviewed. No pertinent family history.  History   Social History  . Marital Status: Married    Spouse Name: N/A  . Number of Children: N/A  . Years of Education: N/A   Social History Main Topics  . Smoking status: Never Smoker   . Smokeless tobacco: Never Used  . Alcohol Use: Yes     Comment: SOCIALLY  . Drug Use: No  . Sexual Activity: Yes    Birth Control/ Protection: Surgical   Other Topics Concern  . None   Social History Narrative     Review of Systems: A 12 point ROS discussed and pertinent positives are indicated in the HPI above.  All other systems are negative.  Review of Systems  Constitutional: Negative for activity change and fatigue.  HENT: Positive for tinnitus. Negative for ear pain, facial swelling, hearing loss, trouble swallowing and voice change.   Respiratory: Negative for cough and shortness of breath.   Cardiovascular: Negative for chest pain.  Gastrointestinal: Negative for abdominal pain.  Genitourinary: Negative for difficulty urinating.  Musculoskeletal: Negative for back pain.  Neurological: Positive for headaches. Negative for dizziness, tremors, seizures, syncope, facial asymmetry, speech difficulty, weakness, light-headedness and numbness.  Psychiatric/Behavioral: Negative for behavioral problems and confusion.    Vital Signs: BP 157/79 mmHg  Pulse 72  Temp(Src) 98.3 F (36.8 C) (Oral)  Resp 20  Ht  (1.651 m)  Wt 72.122 kg (159 lb)  BMI 26.46 kg/m2  SpO2 100%  LMP 11/09/2014 (Exact Date)  Physical Exam  Constitutional: She is oriented to person, place, and time. She appears well-developed and well-nourished.   HENT:  Head: Normocephalic and atraumatic.  Eyes: EOM are normal.  Neck: Neck supple.  Cardiovascular: Normal rate, regular rhythm and normal heart sounds.   No murmur heard. Pulmonary/Chest: Breath sounds normal. She has no wheezes.  Abdominal: Soft. Bowel sounds are normal. There is no tenderness.  Musculoskeletal: Normal range of motion.  Neurological: She is alert and oriented to person, place, and time.  Skin: Skin is warm and dry.  Psychiatric: She has a normal mood and affect. Her behavior is normal. Thought content normal.  Nursing note and vitals reviewed.   Mallampati Score:  MD Evaluation Airway: WNL Heart: WNL Abdomen: WNL Chest/ Lungs: WNL ASA  Classification: 2 Mallampati/Airway Score: One  Imaging: Ir Radiologist Eval & Mgmt  10/12/2014   EXAM: NEW PATIENT OFFICE VISIT  CHIEF COMPLAINT: Bilateral sensory neural loss left greater than right. Worsening left-sided pulsatile tinnitus.  Current Pain Level: 1-10  HISTORY OF PRESENT ILLNESS: The patient is a 54 year old right-handed lady who has been referred by Dr. Dorma Russell for evaluation of her left-sided pulsatile tinnitus apparently worsening.  According to the patient she has had on and off symptoms of high-frequency ringing in both ears apparently right greater than left for quite some time.  She has also had symptoms for which she describes as intermittent pulsatile noise in the left ear. For this she has undergone multiple episodes of workup which have included MRI MRA and a recent MRI at [facility].  The noise apparently is more prominent at night time and apparently more so while she is sleeping on the right side. The pulsatility may improved with the patient shifting positions.  She has also noticed that after traveling by air, the pulsatile noise apparently becomes significantly less severe for a few months.  She also reports that the noise becomes louder with tachycardia and straining.  She denies any chest pains  or shortness of breath. She denies any visual symptoms, blindness, eye redness, gait difficulties, swallowing difficulties, or motor or sensory symptoms in her limbs associated with these.  She denies having experienced any severe vascular headaches either just prior, during or after these spells.  She denies any episodes of loss of consciousness or seizure-like activity. She claims that these spells appear not to be related to any special foods or beverages. She has also intermittently experienced though unrelated to her pulsatile tinnitus episodes of perioral numbness without associated symptoms also for some time.  Past Medical History: No other significant past medical history per se.  Previous Surgeries:D&C in 1999. Laparoscopic exploratory for infertility in 1997. Removal of a polyp in the uterus with bilateral tubal ligation in the last 5 years.  Medications:  Zyrtec. Multi vitamins.  Allergies: No known allergies.  Social History: The patient is married and has 2 adopted children. Has one sister who is alive and well. She does have hypothyroid problems. 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 She drinks occasional wine. Denies cigarette smoking. Denies use  of illicit drugs.  Family History: Significant for cancer of the breast in her sister, father with colon cancer and prostate cancer. Diabetes in her uncle.  REVIEW OF SYSTEMS: Essentially negative for pathologic symptomatology. Otherwise, as stated above.  PHYSICAL EXAMINATION: Brief examination revealed the patient to be in no acute distress. Affect normal. Neurologically nonfocal.  ASSESSMENT AND PLAN: The report of the recent MRI of the brain [facility] was reviewed. This apparently revealed a 5 mm descent of the cerebellar tonsils below the level of the foramen magnum reportedly consistent with cerebellar ectopia.  Also reviewed was the MRI of the brain and MRA of the brain and MRV of the brain from June, 2011. This  apparently revealed low signal in the left transverse sinus compared with the other dural sinuses.  Given the patient's clinical history, and the above imaging findings, it was felt the patient would need a formal catheter angiogram to evaluate for abnormal fistulous communications between the extracranial vasculature and the intracranial venous compartment such as a dural AV fistula or an arteriovenous malformation. Also further evaluation of the previously noted attenuated flow signal in the left transverse sinus would be evaluated.  The procedure, the risks, the benefits and alternatives were all reviewed with the patient. The patient is in agreement to proceed with a formal catheter angiogram. This will be scheduled at the earliest possible. In the meantime the patient has been asked to maintain adequate hydration and refrain from caffeine containing compounds. Also she was asked to call should she have any concerns or questions.   Electronically Signed   By: Julieanne CottonSanjeev  Deveshwar M.D.   On: 10/10/2014 14:58    Labs:  CBC:  Recent Labs  11/09/14 0900  WBC 6.9  HGB 14.0  HCT 41.9  PLT 284    COAGS:  Recent Labs  11/09/14 0900  INR 1.03    BMP:  Recent Labs  11/09/14 0900  NA 139  K 4.1  CL 106  CO2 28  GLUCOSE 104*  BUN 8  CALCIUM 9.4  CREATININE 0.83  GFRNONAA 80*  GFRAA >90    LIVER FUNCTION TESTS: No results for input(s): BILITOT, AST, ALT, ALKPHOS, PROT, ALBUMIN in the last 8760 hours.  TUMOR MARKERS: No results for input(s): AFPTM, CEA, CA199, CHROMGRNA in the last 8760 hours.  Assessment and Plan:  L ear tinnitus for yrs Abnormal MRI Scheduled for cerebral arteriogram for evaluation Pt aware of procedure benefits and risks including but not limited to: Infection; bleeding; vessel damage; CVA Agreeable to proceed Consent signed andin chart  Thank you for this interesting consult.  I greatly enjoyed meeting Christine DoppCindy M [name] and look forward to participating  in their care.  Signed: Gurtej Noyola A 11/09/2014, 10:01 AM   I spent a total of  20 Minutes   in face to face in clinical consultation, greater than 50% of which was counseling/coordinating care for Cerebral arteriogram

## 2015-02-07 ENCOUNTER — Other Ambulatory Visit: Payer: Self-pay | Admitting: Obstetrics and Gynecology

## 2015-02-08 LAB — CYTOLOGY - PAP

## 2015-10-04 ENCOUNTER — Telehealth (HOSPITAL_COMMUNITY): Payer: Self-pay

## 2015-10-04 NOTE — Telephone Encounter (Signed)
Called to schedule 1 year f/u MRI/MRA, left message for pt to call back. AW 

## 2015-10-05 ENCOUNTER — Other Ambulatory Visit (HOSPITAL_COMMUNITY): Payer: Self-pay | Admitting: Interventional Radiology

## 2015-10-05 DIAGNOSIS — H9319 Tinnitus, unspecified ear: Secondary | ICD-10-CM

## 2015-10-22 ENCOUNTER — Ambulatory Visit (HOSPITAL_COMMUNITY): Admission: RE | Admit: 2015-10-22 | Payer: PRIVATE HEALTH INSURANCE | Source: Ambulatory Visit

## 2015-10-22 ENCOUNTER — Ambulatory Visit (HOSPITAL_COMMUNITY): Payer: PRIVATE HEALTH INSURANCE

## 2015-11-02 ENCOUNTER — Telehealth (HOSPITAL_COMMUNITY): Payer: Self-pay | Admitting: Interventional Radiology

## 2015-11-02 NOTE — Telephone Encounter (Signed)
Called pt, left VM on her home phone and her cell phone to call me back regarding her MRI/MRA done on 11/01/15. JM

## 2015-11-05 ENCOUNTER — Other Ambulatory Visit (HOSPITAL_COMMUNITY): Payer: Self-pay | Admitting: Interventional Radiology

## 2015-11-05 ENCOUNTER — Telehealth (HOSPITAL_COMMUNITY): Payer: Self-pay | Admitting: Interventional Radiology

## 2015-11-05 DIAGNOSIS — H9319 Tinnitus, unspecified ear: Secondary | ICD-10-CM

## 2015-11-05 NOTE — Telephone Encounter (Signed)
Called pt and spoke to her about her recent MRI/MRA done at [facility]. Per Dr. Corliss Skains he would like for her to see a dermatologist. We also scheduled her to come in for a consult to discuss with Dr. Corliss Skains on 11/23/14. Pt states understanding and is in agreement with her plan of care. JM

## 2015-11-23 ENCOUNTER — Ambulatory Visit (HOSPITAL_COMMUNITY): Payer: PRIVATE HEALTH INSURANCE

## 2015-12-14 ENCOUNTER — Ambulatory Visit (HOSPITAL_COMMUNITY): Admission: RE | Admit: 2015-12-14 | Payer: PRIVATE HEALTH INSURANCE | Source: Ambulatory Visit

## 2016-03-06 ENCOUNTER — Telehealth (HOSPITAL_COMMUNITY): Payer: Self-pay

## 2016-03-06 ENCOUNTER — Telehealth (HOSPITAL_COMMUNITY): Payer: Self-pay | Admitting: Interventional Radiology

## 2016-03-06 NOTE — Telephone Encounter (Signed)
Pt called to get clarification on MRI results from Dr. Corliss Skainseveshwar. Told pt that Dr. Had been out of town and that the information was laying on his desk to review. Pt wanted to know why it was taking so long. Told her that I would get with D and have him review. Pt agreed with this. AW

## 2016-03-06 NOTE — Telephone Encounter (Signed)
Pt called and was extremely upset that her questions "still have not been answered." I have had multiple conversations with this patient since her angiogram done in January 2017. Morrie Sheldonshley has spoken to this patient as well on a few occasions. The patient is simply not satisfied with the information that have been able to give her. [facility] and he was told that he needed to speak to this patient directly. We all felt as though we had given this patient the information she had requested, yet she is not satisfied. I called the patient back on 03/05/16 and spoke with her yet again, and she still was not content. I then asked her to hold and Dr. Corliss Skainseveshwar got on the phone with the patient. He spent approximately 20 minutes on the phone with the patient answering the same questions that she continues to ask over and over. The patient seemed satisfied with his answers, however I suspect that she will call again with these questions at some point in the near future. This seems to be becoming a pattern with her. We will help her as best we can when she does call. JM

## 2016-03-28 ENCOUNTER — Telehealth (HOSPITAL_COMMUNITY): Payer: Self-pay | Admitting: Interventional Radiology

## 2016-03-28 NOTE — Telephone Encounter (Signed)
Pt called and I returned her call. She again continues to ask the same questions regarding her MRI/MRA study and the possibility of developing an aneurysm. I talked w/ the pt for approximately 15-20 minutes. I went back and asked Dr. Corliss Skainseveshwar the same questions again about this patient. He states the same thing, that the images that he needs or would like to see are NOT on the CD that she provided. The standard MRA should have included these types of images. I encouraged her to [facility] have her MRA performed and ask them why there was not a complete, standard MRA performed and told her to call us back. JM

## 2016-04-10 ENCOUNTER — Telehealth (HOSPITAL_COMMUNITY): Payer: Self-pay | Admitting: Interventional Radiology

## 2016-04-10 NOTE — Telephone Encounter (Signed)
Returned pt's call, left VM for her to call back to schedule appt. JM

## 2016-04-17 ENCOUNTER — Ambulatory Visit (HOSPITAL_COMMUNITY): Payer: PRIVATE HEALTH INSURANCE

## 2016-04-29 ENCOUNTER — Ambulatory Visit (HOSPITAL_COMMUNITY)
Admission: RE | Admit: 2016-04-29 | Discharge: 2016-04-29 | Disposition: A | Discharge status: Home / Self Care | Payer: PRIVATE HEALTH INSURANCE | Source: Ambulatory Visit | Attending: Interventional Radiology | Admitting: Interventional Radiology

## 2016-04-29 DIAGNOSIS — H9319 Tinnitus, unspecified ear: Secondary | ICD-10-CM

## 2016-04-29 HISTORY — PX: IR GENERIC HISTORICAL: IMG1180011

## 2016-05-06 ENCOUNTER — Encounter (HOSPITAL_COMMUNITY): Payer: Self-pay | Admitting: Interventional Radiology

## 2017-05-27 ENCOUNTER — Other Ambulatory Visit: Payer: Self-pay | Admitting: Obstetrics and Gynecology

## 2017-05-27 DIAGNOSIS — Z803 Family history of malignant neoplasm of breast: Secondary | ICD-10-CM

## 2017-08-20 ENCOUNTER — Telehealth (HOSPITAL_COMMUNITY): Payer: Self-pay

## 2017-08-20 NOTE — Telephone Encounter (Signed)
Called to schedule f/u mri and get updated insurance info. No answer, left vm. AW

## 2017-12-23 ENCOUNTER — Ambulatory Visit
Admission: RE | Admit: 2017-12-23 | Discharge: 2017-12-23 | Disposition: A | Discharge status: Home / Self Care | Payer: PRIVATE HEALTH INSURANCE | Source: Ambulatory Visit | Attending: Internal Medicine | Admitting: Internal Medicine

## 2017-12-23 ENCOUNTER — Other Ambulatory Visit: Payer: Self-pay | Admitting: Internal Medicine

## 2017-12-23 DIAGNOSIS — R1031 Right lower quadrant pain: Secondary | ICD-10-CM

## 2017-12-23 DIAGNOSIS — R110 Nausea: Secondary | ICD-10-CM

## 2017-12-23 DIAGNOSIS — R140 Abdominal distension (gaseous): Secondary | ICD-10-CM

## 2017-12-23 MED ORDER — IOPAMIDOL (ISOVUE-300) INJECTION 61%
100.0000 mL | Freq: Once | INTRAVENOUS | Status: AC | PRN
  Administered 2017-12-23: 100 mL via INTRAVENOUS

## 2017-12-25 ENCOUNTER — Emergency Department (HOSPITAL_COMMUNITY): Payer: PRIVATE HEALTH INSURANCE | Admitting: Anesthesiology

## 2017-12-25 ENCOUNTER — Encounter (HOSPITAL_COMMUNITY)
Admission: EM | Disposition: A | Discharge status: Home / Self Care | Payer: Self-pay | Source: Home / Self Care | Attending: Emergency Medicine

## 2017-12-25 ENCOUNTER — Observation Stay (HOSPITAL_COMMUNITY)
Admission: EM | Admit: 2017-12-25 | Discharge: 2017-12-26 | Disposition: A | Discharge status: Home / Self Care | Payer: PRIVATE HEALTH INSURANCE | Attending: General Surgery | Admitting: General Surgery

## 2017-12-25 ENCOUNTER — Encounter (HOSPITAL_COMMUNITY): Payer: Self-pay | Admitting: *Deleted

## 2017-12-25 ENCOUNTER — Other Ambulatory Visit: Payer: Self-pay

## 2017-12-25 DIAGNOSIS — Z79899 Other long term (current) drug therapy: Secondary | ICD-10-CM | POA: Diagnosis not present

## 2017-12-25 DIAGNOSIS — K3580 Unspecified acute appendicitis: Secondary | ICD-10-CM | POA: Diagnosis present

## 2017-12-25 DIAGNOSIS — I341 Nonrheumatic mitral (valve) prolapse: Secondary | ICD-10-CM | POA: Diagnosis not present

## 2017-12-25 DIAGNOSIS — K37 Unspecified appendicitis: Secondary | ICD-10-CM | POA: Diagnosis present

## 2017-12-25 DIAGNOSIS — Z884 Allergy status to anesthetic agent status: Secondary | ICD-10-CM | POA: Insufficient documentation

## 2017-12-25 HISTORY — PX: LAPAROSCOPIC APPENDECTOMY: SHX408

## 2017-12-25 LAB — CBC WITH DIFFERENTIAL/PLATELET
BASOS PCT: 1 %
Basophils Absolute: 0 10*3/uL (ref 0.0–0.1)
Eosinophils Absolute: 0.1 10*3/uL (ref 0.0–0.7)
Eosinophils Relative: 1 %
HCT: 44.7 % (ref 36.0–46.0)
HEMOGLOBIN: 14.7 g/dL (ref 12.0–15.0)
LYMPHS ABS: 2.2 10*3/uL (ref 0.7–4.0)
Lymphocytes Relative: 31 %
MCH: 29.9 pg (ref 26.0–34.0)
MCHC: 32.9 g/dL (ref 30.0–36.0)
MCV: 91 fL (ref 78.0–100.0)
Monocytes Absolute: 0.5 10*3/uL (ref 0.1–1.0)
Monocytes Relative: 8 %
NEUTROS PCT: 59 %
Neutro Abs: 4.2 10*3/uL (ref 1.7–7.7)
Platelets: 285 10*3/uL (ref 150–400)
RBC: 4.91 MIL/uL (ref 3.87–5.11)
RDW: 12.8 % (ref 11.5–15.5)
WBC: 7 10*3/uL (ref 4.0–10.5)

## 2017-12-25 LAB — BASIC METABOLIC PANEL
Anion gap: 12 (ref 5–15)
BUN: 13 mg/dL (ref 6–20)
CHLORIDE: 104 mmol/L (ref 101–111)
CO2: 23 mmol/L (ref 22–32)
Calcium: 9.6 mg/dL (ref 8.9–10.3)
Creatinine, Ser: 0.79 mg/dL (ref 0.44–1.00)
GFR calc non Af Amer: >60 mL/min (ref >60)
Glucose, Bld: 90 mg/dL (ref 65–99)
Potassium: 4.9 mmol/L (ref 3.5–5.1)
Sodium: 139 mmol/L (ref 135–145)

## 2017-12-25 SURGERY — APPENDECTOMY, LAPAROSCOPIC
Anesthesia: General

## 2017-12-25 MED ORDER — SCOPOLAMINE 1 MG/3DAYS TD PT72
MEDICATED_PATCH | TRANSDERMAL | Status: AC
  Filled 2017-12-25: qty 1

## 2017-12-25 MED ORDER — MORPHINE SULFATE (PF) 4 MG/ML IV SOLN: 2.0000 mg | INTRAVENOUS | Status: DC | PRN

## 2017-12-25 MED ORDER — HYDROMORPHONE HCL 1 MG/ML IJ SOLN
INTRAMUSCULAR | Status: AC
  Administered 2017-12-25: 0.5 mg via INTRAVENOUS
  Filled 2017-12-25: qty 1

## 2017-12-25 MED ORDER — DEXAMETHASONE SODIUM PHOSPHATE 10 MG/ML IJ SOLN
INTRAMUSCULAR | Status: AC
  Filled 2017-12-25: qty 1

## 2017-12-25 MED ORDER — ONDANSETRON HCL 4 MG/2ML IJ SOLN
INTRAMUSCULAR | Status: AC
  Filled 2017-12-25: qty 2

## 2017-12-25 MED ORDER — BISACODYL 10 MG RE SUPP: 10.0000 mg | Freq: Every day | RECTAL | Status: DC | PRN

## 2017-12-25 MED ORDER — LACTATED RINGERS IV SOLN
INTRAVENOUS | Status: DC | PRN
  Administered 2017-12-25: 21:00:00 via INTRAVENOUS

## 2017-12-25 MED ORDER — ROCURONIUM BROMIDE 100 MG/10ML IV SOLN
INTRAVENOUS | Status: DC | PRN
  Administered 2017-12-25: 30 mg via INTRAVENOUS

## 2017-12-25 MED ORDER — ENOXAPARIN SODIUM 40 MG/0.4ML ~~LOC~~ SOLN: 40.0000 mg | SUBCUTANEOUS | Status: DC

## 2017-12-25 MED ORDER — SUGAMMADEX SODIUM 200 MG/2ML IV SOLN
INTRAVENOUS | Status: DC | PRN
  Administered 2017-12-25: 200 mg via INTRAVENOUS

## 2017-12-25 MED ORDER — ALPRAZOLAM 0.25 MG PO TABS: 0.1250 mg | ORAL_TABLET | Freq: Every day | ORAL | Status: DC | PRN

## 2017-12-25 MED ORDER — SCOPOLAMINE 1 MG/3DAYS TD PT72
MEDICATED_PATCH | TRANSDERMAL | Status: DC | PRN
  Administered 2017-12-25: 1 via TRANSDERMAL

## 2017-12-25 MED ORDER — OXYCODONE HCL 5 MG PO TABS: 5.0000 mg | ORAL_TABLET | Freq: Once | ORAL | Status: DC | PRN

## 2017-12-25 MED ORDER — FENTANYL CITRATE (PF) 100 MCG/2ML IJ SOLN
INTRAMUSCULAR | Status: DC | PRN
  Administered 2017-12-25 (×3): 50 ug via INTRAVENOUS
  Administered 2017-12-25: 100 ug via INTRAVENOUS

## 2017-12-25 MED ORDER — ONDANSETRON 4 MG PO TBDP: 4.0000 mg | ORAL_TABLET | Freq: Four times a day (QID) | ORAL | Status: DC | PRN

## 2017-12-25 MED ORDER — MIDAZOLAM HCL 2 MG/2ML IJ SOLN
INTRAMUSCULAR | Status: AC
  Filled 2017-12-25: qty 2

## 2017-12-25 MED ORDER — ONDANSETRON HCL 4 MG/2ML IJ SOLN: 4.0000 mg | Freq: Four times a day (QID) | INTRAMUSCULAR | Status: DC | PRN

## 2017-12-25 MED ORDER — SCOPOLAMINE 1 MG/3DAYS TD PT72: 1.0000 | MEDICATED_PATCH | TRANSDERMAL | Status: DC

## 2017-12-25 MED ORDER — BUPIVACAINE-EPINEPHRINE 0.25% -1:200000 IJ SOLN
INTRAMUSCULAR | Status: DC | PRN
  Administered 2017-12-25: 16 mL

## 2017-12-25 MED ORDER — OXYCODONE HCL 5 MG/5ML PO SOLN: 5.0000 mg | Freq: Once | ORAL | Status: DC | PRN

## 2017-12-25 MED ORDER — PHENYLEPHRINE 40 MCG/ML (10ML) SYRINGE FOR IV PUSH (FOR BLOOD PRESSURE SUPPORT)
PREFILLED_SYRINGE | INTRAVENOUS | Status: AC
  Filled 2017-12-25: qty 10

## 2017-12-25 MED ORDER — SENNOSIDES-DOCUSATE SODIUM 8.6-50 MG PO TABS
1.0000 | ORAL_TABLET | Freq: Every day | ORAL | Status: DC
  Filled 2017-12-25: qty 1

## 2017-12-25 MED ORDER — DEXAMETHASONE SODIUM PHOSPHATE 10 MG/ML IJ SOLN
INTRAMUSCULAR | Status: DC | PRN
  Administered 2017-12-25: 10 mg via INTRAVENOUS

## 2017-12-25 MED ORDER — CEFTRIAXONE SODIUM 1 G IJ SOLR
1.0000 g | INTRAMUSCULAR | Status: AC
  Administered 2017-12-25: 1 g via INTRAVENOUS
  Filled 2017-12-25: qty 10

## 2017-12-25 MED ORDER — PROPOFOL 10 MG/ML IV BOLUS
INTRAVENOUS | Status: AC
Start: 2017-12-25 — End: 2017-12-25
  Filled 2017-12-25: qty 20

## 2017-12-25 MED ORDER — BUPIVACAINE-EPINEPHRINE (PF) 0.25% -1:200000 IJ SOLN
INTRAMUSCULAR | Status: AC
  Filled 2017-12-25: qty 30

## 2017-12-25 MED ORDER — ONDANSETRON HCL 4 MG/2ML IJ SOLN
INTRAMUSCULAR | Status: DC | PRN
  Administered 2017-12-25: 4 mg via INTRAVENOUS

## 2017-12-25 MED ORDER — SUGAMMADEX SODIUM 200 MG/2ML IV SOLN
INTRAVENOUS | Status: AC
  Filled 2017-12-25: qty 2

## 2017-12-25 MED ORDER — MIDAZOLAM HCL 5 MG/5ML IJ SOLN
INTRAMUSCULAR | Status: DC | PRN
  Administered 2017-12-25: 2 mg via INTRAVENOUS

## 2017-12-25 MED ORDER — METRONIDAZOLE IN NACL 5-0.79 MG/ML-% IV SOLN
500.0000 mg | INTRAVENOUS | Status: AC
  Administered 2017-12-25: 500 mg via INTRAVENOUS
  Filled 2017-12-25 (×2): qty 100

## 2017-12-25 MED ORDER — PROPOFOL 10 MG/ML IV BOLUS
INTRAVENOUS | Status: DC | PRN
  Administered 2017-12-25: 200 mg via INTRAVENOUS

## 2017-12-25 MED ORDER — LIDOCAINE HCL (CARDIAC) 20 MG/ML IV SOLN
INTRAVENOUS | Status: AC
  Filled 2017-12-25: qty 5

## 2017-12-25 MED ORDER — PHENYLEPHRINE HCL 10 MG/ML IJ SOLN
INTRAMUSCULAR | Status: DC | PRN
  Administered 2017-12-25: 80 ug via INTRAVENOUS

## 2017-12-25 MED ORDER — SIMETHICONE 80 MG PO CHEW: 40.0000 mg | CHEWABLE_TABLET | Freq: Four times a day (QID) | ORAL | Status: DC | PRN

## 2017-12-25 MED ORDER — HYDROMORPHONE HCL 1 MG/ML IJ SOLN
0.2500 mg | INTRAMUSCULAR | Status: DC | PRN
  Administered 2017-12-25 (×4): 0.5 mg via INTRAVENOUS

## 2017-12-25 MED ORDER — KCL IN DEXTROSE-NACL 20-5-0.45 MEQ/L-%-% IV SOLN
INTRAVENOUS | Status: DC
  Administered 2017-12-26: 75 mL/h via INTRAVENOUS
  Filled 2017-12-25: qty 1000

## 2017-12-25 MED ORDER — FENTANYL CITRATE (PF) 250 MCG/5ML IJ SOLN
INTRAMUSCULAR | Status: AC
  Filled 2017-12-25: qty 5

## 2017-12-25 MED ORDER — HYDROCODONE-ACETAMINOPHEN 5-325 MG PO TABS
1.0000 | ORAL_TABLET | ORAL | Status: DC | PRN
  Administered 2017-12-26: 1 via ORAL
  Filled 2017-12-25: qty 1

## 2017-12-25 MED ORDER — PROMETHAZINE HCL 25 MG/ML IJ SOLN: 6.2500 mg | INTRAMUSCULAR | Status: DC | PRN

## 2017-12-25 MED ORDER — LIDOCAINE HCL (CARDIAC) 20 MG/ML IV SOLN
INTRAVENOUS | Status: DC | PRN
  Administered 2017-12-25: 60 mg via INTRAVENOUS

## 2017-12-25 SURGICAL SUPPLY — 48 items
ADH SKN CLS APL DERMABOND .7 (GAUZE/BANDAGES/DRESSINGS) ×1
ADH SKN CLS LQ APL DERMABOND (GAUZE/BANDAGES/DRESSINGS) ×1
APPLIER CLIP ROT 10 11.4 M/L (STAPLE)
APR CLP MED LRG 11.4X10 (STAPLE)
BAG SPEC RTRVL LRG 6X4 10 (ENDOMECHANICALS) ×2
BLADE CLIPPER SURG (BLADE) IMPLANT
CANISTER SUCT 3000ML PPV (MISCELLANEOUS) ×3 IMPLANT
CHLORAPREP W/TINT 26ML (MISCELLANEOUS) ×3 IMPLANT
CLIP APPLIE ROT 10 11.4 M/L (STAPLE) IMPLANT
CLOSURE WOUND 1/2 X4 (GAUZE/BANDAGES/DRESSINGS) ×1
COVER SURGICAL LIGHT HANDLE (MISCELLANEOUS) ×3 IMPLANT
CUTTER FLEX LINEAR 45M (STAPLE) ×3 IMPLANT
DERMABOND ADHESIVE PROPEN (GAUZE/BANDAGES/DRESSINGS) ×2
DERMABOND ADVANCED (GAUZE/BANDAGES/DRESSINGS) ×2
DERMABOND ADVANCED .7 DNX12 (GAUZE/BANDAGES/DRESSINGS) ×1 IMPLANT
DERMABOND ADVANCED .7 DNX6 (GAUZE/BANDAGES/DRESSINGS) ×1 IMPLANT
DRSG TEGADERM 2-3/8X2-3/4 SM (GAUZE/BANDAGES/DRESSINGS) ×9 IMPLANT
ELECT REM PT RETURN 9FT ADLT (ELECTROSURGICAL) ×3
ELECTRODE REM PT RTRN 9FT ADLT (ELECTROSURGICAL) ×1 IMPLANT
ENDOLOOP SUT PDS II  0 18 (SUTURE)
ENDOLOOP SUT PDS II 0 18 (SUTURE) IMPLANT
GLOVE BIOGEL PI IND STRL 8 (GLOVE) ×1 IMPLANT
GLOVE BIOGEL PI INDICATOR 8 (GLOVE) ×2
GLOVE ECLIPSE 7.5 STRL STRAW (GLOVE) ×3 IMPLANT
GOWN STRL REUS W/ TWL LRG LVL3 (GOWN DISPOSABLE) ×3 IMPLANT
GOWN STRL REUS W/TWL LRG LVL3 (GOWN DISPOSABLE) ×9
KIT BASIN OR (CUSTOM PROCEDURE TRAY) ×3 IMPLANT
KIT TURNOVER KIT B (KITS) ×3 IMPLANT
NS IRRIG 1000ML POUR BTL (IV SOLUTION) ×3 IMPLANT
PAD ARMBOARD 7.5X6 YLW CONV (MISCELLANEOUS) ×6 IMPLANT
POUCH SPECIMEN RETRIEVAL 10MM (ENDOMECHANICALS) ×6 IMPLANT
RELOAD 45 VASCULAR/THIN (ENDOMECHANICALS) IMPLANT
RELOAD STAPLE TA45 3.5 REG BLU (ENDOMECHANICALS) ×9 IMPLANT
SCISSORS LAP 5X35 DISP (ENDOMECHANICALS) ×2 IMPLANT
SET IRRIG TUBING LAPAROSCOPIC (IRRIGATION / IRRIGATOR) ×3 IMPLANT
SHEARS HARMONIC ACE PLUS 36CM (ENDOMECHANICALS) ×3 IMPLANT
SLEEVE ENDOPATH XCEL 5M (ENDOMECHANICALS) ×3 IMPLANT
SPECIMEN JAR SMALL (MISCELLANEOUS) ×3 IMPLANT
STRIP CLOSURE SKIN 1/2X4 (GAUZE/BANDAGES/DRESSINGS) ×2 IMPLANT
SUT MNCRL AB 4-0 PS2 18 (SUTURE) ×3 IMPLANT
TOWEL OR 17X24 6PK STRL BLUE (TOWEL DISPOSABLE) ×3 IMPLANT
TOWEL OR 17X26 10 PK STRL BLUE (TOWEL DISPOSABLE) ×3 IMPLANT
TRAY FOLEY CATH SILVER 16FR (SET/KITS/TRAYS/PACK) ×3 IMPLANT
TRAY LAPAROSCOPIC MC (CUSTOM PROCEDURE TRAY) ×3 IMPLANT
TROCAR XCEL BLUNT TIP 100MML (ENDOMECHANICALS) ×3 IMPLANT
TROCAR XCEL NON-BLD 5MMX100MML (ENDOMECHANICALS) ×3 IMPLANT
TUBING INSUFFLATION (TUBING) ×3 IMPLANT
WATER STERILE IRR 1000ML POUR (IV SOLUTION) ×3 IMPLANT

## 2017-12-25 NOTE — Op Note (Signed)
Christine [name] [id number]   PRE-OPERATIVE DIAGNOSIS:  acute appendicitis  POST-OPERATIVE DIAGNOSIS:  acute appendicitis   Procedure(s): APPENDECTOMY LAPAROSCOPIC   Surgeon(s): Romie Leveehomas, Bailynn Dyk, MD  ASSISTANT: none   ANESTHESIA:   local and general  EBL:   20ml  Delay start of Pharmacological VTE agent (>24hrs) due to surgical blood loss or risk of bleeding:  no  DRAINS: none   SPECIMEN:  Source of Specimen:  appendix and base of cecum  DISPOSITION OF SPECIMEN:  PATHOLOGY  COUNTS:  YES  PLAN OF CARE: Admit to inpatient   PATIENT DISPOSITION:  PACU - hemodynamically stable.   INDICATIONS: Patient with concerning symptoms & work up suspicious for appendicitis.  Surgery was recommended by Dr Lindie SpruceWyatt.  He was called emergently to a trauma after she was under anesthesia and I was called in to take over her surgery.  Situation was explained to the patient's husband.  I reviewed her medical record and CT and confirmed Dr Dixon BoosWyatt's plan.   OR FINDINGS: inflamed appendiceal base  DESCRIPTION:   The patient was identified & brought into the operating room. The patient was positioned supine with left arm tucked. SCDs were active during the entire case. The patient underwent general anesthesia without any difficulty.  A foley catheter was inserted under sterile conditions. The abdomen was prepped and draped in a sterile fashion. A Surgical Timeout confirmed our plan.   I made a transverse incision through the superior umbilical fold.  I made a nick in the infraumbilical fascia and confirmed peritoneal entry.  I placed a stay suture and then the [facility].  We induced carbon dioxide insufflation.  Camera inspection revealed no injury.  I placed additional ports under direct laparoscopic visualization.  I mobilized the terminal ileum to proximal ascending colon in a lateral to medial fashion.  I took care to avoid injuring any retroperitoneal structures.   I freed the appendix off its  attachments to the ascending colon and cecal mesentery.  I elevated the appendix.  I was able to free off the base of the appendix, which was still viable using the harmonic scalpel.  The base of appendix and surrounding cecum was edematous and inflamed.  I stapled off the cecum ~2 cm distal to this with 3 blue load 45 mm stapler fires.  Cecum was healthy and viable at this point.  I placed the appendix inside an EndoCatch bag and removed out the [facility] port.  I did copious irrigation. Hemostasis was good in the mesoappendix, colon mesentery, and retroperitoneum. Staple line was intact on the cecum with no bleeding. I washed out the pelvis, retrohepatic space and right paracolic gutter.  Hemostasis is good. There was no perforation or injury.  Because the area cleaned up well after irrigation, I did not place a drain.  I aspirated the carbon dioxide. I removed the ports. I closed the umbilical fascia site using a 0 Vicryl stitch. I closed skin using 4-0 vicryl stitch.  Sterile dressings were applied.  Patient was extubated and sent to the recovery room.  I discussed the operative findings with the patient's family. I suspect the patient is going used in the hospital at least overnight and will need antibiotics for 0 days. Questions answered. They expressed understanding and appreciation.

## 2017-12-25 NOTE — ED Triage Notes (Signed)
Pt send by primary MD for eval of appendicitis. MD spoke with Dr. Dwain SarnaWakefield with [facility]. Requests general labs be drawn in triage

## 2017-12-25 NOTE — ED Notes (Signed)
Short stay called to say pt ready to come to room 36

## 2017-12-25 NOTE — Transfer of Care (Signed)
Immediate Anesthesia Transfer of Care Note  Patient: Christine [name]  Procedure(s) Performed: APPENDECTOMY LAPAROSCOPIC (N/A )  Patient Location: PACU  Anesthesia Type:General  Level of Consciousness: awake  Airway & Oxygen Therapy: Patient Spontanous Breathing  Post-op Assessment: Report given to RN and Post -op Vital signs reviewed and stable  Post vital signs: Reviewed and stable  Last Vitals:  Vitals Value Taken Time  BP 128/67 12/25/2017 10:18 PM  Temp    Pulse 77 12/25/2017 10:21 PM  Resp 12 12/25/2017 10:21 PM  SpO2 95 % 12/25/2017 10:21 PM  Vitals shown include unvalidated device data.  Last Pain:  Vitals:   12/25/17 2215  TempSrc:   PainSc: (P) 6       Patients Stated Pain Goal: 0 (12/25/17 1851)  Complications: No apparent anesthesia complications

## 2017-12-25 NOTE — ED Provider Notes (Signed)
[facility] EMERGENCY DEPARTMENT Provider Note   CSN: [id number] Arrival date & time: 12/25/17  1316     History   Chief Complaint Chief Complaint  Patient presents with  . Abdominal Pain    HPI Christine [name] is a 57 y.o. female.  HPI   57 year old female sent here from PCP Dr. Ferd Hibbsusso's office for surgical evaluation and possible appendectomy.  Pt report  R side abd pain x 5 days, radiates to inner thigh to R back.  Pain now crampy and persistent, currently 3/10.  Has had this pain x 1 year but recently increased. LBM today without difficulty.  Does report bloatness and constipation.  Denies fever, chills, c/p, sob, n/v, dysuria, diarrhea.  No medication changes.  Occasional alcohol use, no tobacco use.      Past Medical History:  Diagnosis Date  . MVP (mitral valve prolapse)    NO PROBLEMS  . PONV (postoperative nausea and vomiting)     Patient Active Problem List   Diagnosis Date Noted  . Left-sided tinnitus   . Endometrial polyp 02/10/2013    Class: Hospitalized for    Past Surgical History:  Procedure Laterality Date  . DIAGNOSTIC LAPAROSCOPY  1997   infertility  . DILATATION & CURRETTAGE/HYSTEROSCOPY WITH RESECTOCOPE N/A 02/10/2013   Procedure: DILATATION & CURETTAGE/HYSTEROSCOPY WITH RESECTOCOPE;  Surgeon: Juluis MireJohn S McComb, MD;  Location: [facility];  Service: Gynecology;  Laterality: N/A;  . DILATION AND CURETTAGE OF UTERUS  2009   mas at 89[facility]-vag del but also needed d & c  . infertility     ivf treatment  . IR GENERIC HISTORICAL  04/29/2016   IR RADIOLOGIST EVAL & MGMT 04/29/2016 [facility]-INTERV RAD  . TUBAL LIGATION  2009  . WISDOM TOOTH EXTRACTION       OB History   None      Home Medications    Prior to Admission medications   Medication Sig Start Date End Date Taking? Authorizing Provider  Polyethyl Glycol-Propyl Glycol (SYSTANE ULTRA OP) Place 1 drop into both eyes 2 (two) times daily as needed (dry eyes).   Yes [provider]    ALPRAZolam Prudy Feeler(XANAX) 0.25 MG tablet Take by mouth daily as needed for sleep or anxiety.     [provider]  amoxicillin (AMOXIL) 500 MG capsule Take 500 mg by mouth 3 (three) times daily. FOR  7 DAYS 12/18/17 12/27/17  [provider]  cetirizine (ZYRTEC) 10 MG tablet Take 10 mg by mouth at bedtime.    [provider]  Coenzyme Q10 (COQ10) 100 MG CAPS Take 100 mg by mouth daily.    [provider]  ibuprofen (ADVIL,MOTRIN) 200 MG tablet Take 400 mg by mouth 2 (two) times daily as needed for headache.     [provider]  oxyCODONE-acetaminophen (PERCOCET) 7.5-325 MG per tablet Take 1 tablet by mouth every 4 (four) hours as needed for pain. Patient not taking: Reported on 12/25/2017 02/10/13   Richardean ChimeraMcComb, John, MD    Family History No family history on file.  Social History Social History   Tobacco Use  . Smoking status: Never Smoker  . Smokeless tobacco: Never Used  Substance Use Topics  . Alcohol use: Yes    Comment: SOCIALLY  . Drug use: No     Allergies   Other; Neosporin [neomycin-bacitracin zn-polymyx]; Benzalkonium chloride; and Thimerosal   Review of Systems Review of Systems  All other systems reviewed and are negative.    Physical  Exam Updated Vital Signs BP 140/80 (BP Location: Right Arm)   Pulse 68   Temp 98.4 F (36.9 C) (Oral)   Resp 17   Ht 5\' 4"  (1.626 m)   Wt 78 kg (172 lb)   SpO2 100%   BMI 29.52 kg/m   Physical Exam  Constitutional: She appears well-developed and well-nourished. No distress.  HENT:  Head: Atraumatic.  Eyes: Conjunctivae are normal.  Neck: Neck supple.  Cardiovascular: Normal rate and regular rhythm.  Pulmonary/Chest: Effort normal and breath sounds normal.  Abdominal: Normal appearance and normal aorta. There is tenderness in the right lower quadrant. There is rebound, guarding and tenderness at McBurney's point.  Neurological: She is alert.  Skin: No rash noted.  Psychiatric: She has a  normal mood and affect.  Nursing note and vitals reviewed.    ED Treatments / Results  Labs (all labs ordered are listed, but only abnormal results are displayed) Labs Reviewed  BASIC METABOLIC PANEL  CBC WITH DIFFERENTIAL/PLATELET    EKG None  Radiology No results found.  Procedures Procedures (including critical care time)  Medications Ordered in ED Medications  cefTRIAXone (ROCEPHIN) 1 g in sodium chloride 0.9 % 100 mL IVPB (has no administration in time range)  metroNIDAZOLE (FLAGYL) IVPB 500 mg (has no administration in time range)     Initial Impression / Assessment and Plan / ED Course  I have reviewed the triage vital signs and the nursing notes.  Pertinent labs & imaging results that were available during my care of the patient were reviewed by me and considered in my medical decision making (see chart for details).     BP 140/80 (BP Location: Right Arm)   Pulse 68   Temp 98.4 F (36.9 C) (Oral)   Resp 17   Ht 5\' 4"  (1.626 m)   Wt 78 kg (172 lb)   SpO2 100%   BMI 29.52 kg/m    Final Clinical Impressions(s) / ED Diagnoses   Final diagnoses:  Acute appendicitis, unspecified acute appendicitis type    ED Discharge Orders    None     7:10 PM Pt here with RLQ abd pain.  Recent CT scan demonstrates finding suggestive of early appendicitis.  General Surgery Dr. Lindie Spruce is at bedside evaluating pt.  Anticipate appendectomy.    Fayrene Helper, PA-C 12/25/17 1918    Pricilla Loveless, MD 12/26/17 2251

## 2017-12-25 NOTE — Anesthesia Procedure Notes (Signed)
Procedure Name: Intubation Date/Time: 12/25/2017 9:17 PM Performed by: Purvis Kilts, CRNA Pre-anesthesia Checklist: Patient identified, Emergency Drugs available, Suction available, Patient being monitored and Timeout performed Patient Re-evaluated:Patient Re-evaluated prior to induction Oxygen Delivery Method: Circle system utilized Preoxygenation: Pre-oxygenation with 100% oxygen Induction Type: IV induction Ventilation: Mask ventilation without difficulty Laryngoscope Size: Mac and 3 Grade View: Grade I Tube type: Oral Tube size: 7.0 mm Number of attempts: 1 Airway Equipment and Method: Stylet Placement Confirmation: ETT inserted through vocal cords under direct vision,  positive ETCO2 and breath sounds checked- equal and bilateral Secured at: 21 cm Tube secured with: Tape Dental Injury: Teeth and Oropharynx as per pre-operative assessment

## 2017-12-25 NOTE — Anesthesia Preprocedure Evaluation (Addendum)
Anesthesia Evaluation  Patient identified by MRN, date of birth, ID band Patient awake    Reviewed: Allergy & Precautions, NPO status , Patient's Chart, lab work & pertinent test results  History of Anesthesia Complications (+) PONV and history of anesthetic complications  Airway Mallampati: II  TM Distance: >3 FB Neck ROM: Full    Dental no notable dental hx.    Pulmonary neg pulmonary ROS,    Pulmonary exam normal breath sounds clear to auscultation       Cardiovascular Normal cardiovascular exam+ Valvular Problems/Murmurs MVP  Rhythm:Regular Rate:Normal  MVP (mitral valve prolapse)   Neuro/Psych negative neurological ROS  negative psych ROS   GI/Hepatic negative GI ROS, Neg liver ROS,   Endo/Other  negative endocrine ROS  Renal/GU negative Renal ROS     Musculoskeletal negative musculoskeletal ROS (+)   Abdominal   Peds  Hematology negative hematology ROS (+)   Anesthesia Other Findings acute appendicitis  Reproductive/Obstetrics                            Anesthesia Physical Anesthesia Plan  ASA: II and emergent  Anesthesia Plan: General   Post-op Pain Management:    Induction: Intravenous  PONV Risk Score and Plan: Ondansetron, Dexamethasone, Midazolam, Scopolamine patch - Pre-op and Treatment may vary due to age or medical condition  Airway Management Planned: Oral ETT  Additional Equipment:   Intra-op Plan:   Post-operative Plan: Extubation in OR  Informed Consent: I have reviewed the patients History and Physical, chart, labs and discussed the procedure including the risks, benefits and alternatives for the proposed anesthesia with the patient or authorized representative who has indicated his/her understanding and acceptance.   Dental advisory given  Plan Discussed with: CRNA  Anesthesia Plan Comments:         Anesthesia Quick Evaluation

## 2017-12-25 NOTE — ED Notes (Signed)
Lab work completed today

## 2017-12-25 NOTE — H&P (Signed)
Christine [name] is an 57 y.o. female.   Chief Complaint: Abdominal pain and aching HPI: Several weeks of cramping right lower back pain, but starting Tuesday, pain in the right lower abdominal wall.  She is tender there.  No nausea or vomiting.  No fevers or chills.  WBC by report is normal.  Past Medical History:  Diagnosis Date  . MVP (mitral valve prolapse)    NO PROBLEMS  . PONV (postoperative nausea and vomiting)     Past Surgical History:  Procedure Laterality Date  . DIAGNOSTIC LAPAROSCOPY  1997   infertility  . DILATATION & CURRETTAGE/HYSTEROSCOPY WITH RESECTOCOPE N/A 02/10/2013   Procedure: DILATATION & CURETTAGE/HYSTEROSCOPY WITH RESECTOCOPE;  Surgeon: Juluis MireJohn S McComb, MD;  Location: [facility];  Service: Gynecology;  Laterality: N/A;  . DILATION AND CURETTAGE OF UTERUS  2009   mas at 89mons-vag del but also needed d & c  . infertility     ivf treatment  . IR GENERIC HISTORICAL  04/29/2016   IR RADIOLOGIST EVAL & MGMT 04/29/2016 [facility]  . TUBAL LIGATION  2009  . WISDOM TOOTH EXTRACTION      No family history on file. Social History:  reports that she has never smoked. She has never used smokeless tobacco. She reports that she drinks alcohol. She reports that she does not use drugs.  Allergies:  Allergies  Allergen Reactions  . Neosporin [Neomycin-Bacitracin Zn-Polymyx] Other (See Comments)    Redness after several days of exposure  . Benzalkonium Chloride Other (See Comments)    Redness  . Thimerosal Other (See Comments)    Red and irritated eyes     (Not in a hospital admission)  No results found for this or any previous visit (from the past 48 hour(s)). No results found.  Review of Systems  Constitutional: Negative for chills and fever.  Gastrointestinal: Positive for abdominal pain. Negative for nausea and vomiting.  Musculoskeletal: Positive for back pain (right lower back and aching).  All other systems reviewed and are negative.   Blood pressure  140/80, pulse 68, temperature 98.4 F (36.9 C), temperature source Oral, resp. rate 17, height 5\' 4"  (1.626 m), weight 78 kg (172 lb), SpO2 100 %. Physical Exam  Nursing note and vitals reviewed. Constitutional: She is oriented to person, place, and time. She appears well-developed and well-nourished.  HENT:  Head: Normocephalic and atraumatic.  Eyes: Pupils are equal, round, and reactive to light. Conjunctivae and EOM are normal.  Neck: Normal range of motion. Neck supple.  Cardiovascular: Normal rate and regular rhythm.  Respiratory: Effort normal and breath sounds normal.  GI: Soft. Normal appearance and bowel sounds are normal. There is tenderness in the right lower quadrant. There is rebound. There is no rigidity and no guarding.  Musculoskeletal: Normal range of motion.  Neurological: She is alert and oriented to person, place, and time. She has normal reflexes.  Skin: Skin is warm and dry.  Psychiatric: She has a normal mood and affect. Her behavior is normal. Judgment and thought content normal.     Assessment/Plan Probably early acute appendicitis.  To the OR for laparoscopic appendectomy  Jimmye NormanJames Melchor Kirchgessner, MD 12/25/2017, 6:46 PM

## 2017-12-25 NOTE — ED Triage Notes (Signed)
[place] MD[place] sent to have appendectomy, pt reports having lower R back pain and pain worsening recently, pt had R sided rebound tendersness and told to come after CT scan was completed today, A&O x4

## 2017-12-26 ENCOUNTER — Encounter (HOSPITAL_COMMUNITY): Payer: Self-pay | Admitting: *Deleted

## 2017-12-26 MED ORDER — HYDROCODONE-ACETAMINOPHEN 5-325 MG PO TABS: 1.0000 | ORAL_TABLET | Freq: Four times a day (QID) | ORAL | 0 refills | Status: DC | PRN

## 2017-12-26 NOTE — Discharge Instructions (Signed)

## 2017-12-26 NOTE — Discharge Summary (Signed)
Patient ID: Christine [name] [id number] 57 y.o. 09/24/1960  Admit date: 12/25/2017  Discharge date and time: No discharge date for patient encounter.  Admitting Physician: Christine [name], James  Discharge Physician: Christine [name]  Admission Diagnoses: Acute appendicitis, unspecified acute appendicitis type [K35.80]  Discharge Diagnoses: Acute appendicitis  Operations: Procedure(s): APPENDECTOMY LAPAROSCOPIC  Admission Condition: fair  Discharged Condition: good  Indication for Admission: This is a 57 year old female who presented with several weeks of crampy right lower quadrant and back pain.  She had seen Dr. Matthias [name] as an outpatient.  The pain became more severe and she presented to the emergency room.  Exam revealed localized tenderness in the right lower quadrant.  White blood cell count was normal.  CT scan was concerning for early appendicitis without complication   Hospital Course:    The patient was initially seen by Dr. Lindie [name] who became busy with multiple trauma patients.  Dr. Romie LeveeAlicia [name] came in and took the patient to the operating room and performed laparoscopic appendectomy and stapled across the cuff of the cecum because of extension of inflammation on to the cecum.  There was no sign of tumor.  The patient did well overnight and on postop day 1 was begging to go home.  Exam on postop day 1 revealed she was alert and looked well.  Abdomen was soft with active bowel sounds.  Wounds look good.  We agreed that if breakfast went well she could go home.  She was given instructions in diet and activities.  She was asked to call Monday to make an appointment with substernal surgery in 3 weeks.      Diet and activities were discussed       A prescription for Norco was called to her pharmacy  Consults: None  Significant Diagnostic Studies: Surgical pathology, CT scan, lab work  Treatments: surgery: Laparoscopic appendectomy  Disposition: Home  Patient Instructions:  Allergies  as of 12/26/2017      Reactions   Other Nausea And Vomiting   Threw up after anesthesia (specific one not recalled)   Neosporin [neomycin-bacitracin Zn-polymyx] Other (See Comments)   Redness after several days of exposure   Benzalkonium Chloride Other (See Comments)   Redness   Thimerosal Other (See Comments)   Red and irritated eyes      Medication List    TAKE these medications   ALPRAZolam 0.25 MG tablet Commonly known as:  XANAX Take 0.125-0.25 mg by mouth daily as needed for sleep or anxiety.   cetirizine 10 MG tablet Commonly known as:  ZYRTEC Take 10 mg by mouth at bedtime as needed for allergies or rhinitis.   HYDROcodone-acetaminophen 5-325 MG tablet Commonly known as:  NORCO Take 1-2 tablets by mouth every 6 (six) hours as needed for moderate pain or severe pain.   ibuprofen 200 MG tablet Commonly known as:  ADVIL,MOTRIN Take 400 mg by mouth 2 (two) times daily as needed for headache.   oxyCODONE-acetaminophen 7.5-325 MG tablet Commonly known as:  PERCOCET Take 1 tablet by mouth every 4 (four) hours as needed for pain.   SYSTANE ULTRA OP Place 1 drop into both eyes 2 (two) times daily as needed (dry eyes).       Activity: No sports or heavy lifting for 3 weeks Diet: low fat, low cholesterol diet Wound Care: none needed  [facility]:  [facility]    Addendum: I logged on to the [facility] website and reviewed her prescription medication history.  Signed: Angelia Mould. Derrell Lolling, M.D., FACS General and minimally invasive surgery Breast and Colorectal Surgery  12/26/2017, 9:01 AM

## 2017-12-26 NOTE — Anesthesia Postprocedure Evaluation (Signed)
Anesthesia Post Note  Patient: Christine [name]  Procedure(s) Performed: APPENDECTOMY LAPAROSCOPIC (N/A )     Patient location during evaluation: PACU Anesthesia Type: General Level of consciousness: awake and alert Pain management: pain level controlled Vital Signs Assessment: post-procedure vital signs reviewed and stable Respiratory status: spontaneous breathing, nonlabored ventilation, respiratory function stable and patient connected to nasal cannula oxygen Cardiovascular status: blood pressure returned to baseline and stable Postop Assessment: no apparent nausea or vomiting Anesthetic complications: no    Last Vitals:  Vitals:   12/25/17 2315 12/25/17 2348  BP: 140/77 129/77  Pulse: 73 80  Resp: 20 20  Temp:  36.5 C  SpO2: 100% 100%    Last Pain:  Vitals:   12/25/17 2348  TempSrc: Oral  PainSc:                  Catheryn Baconyan P Trestan Vahle

## 2017-12-26 NOTE — Progress Notes (Signed)
Pt discharged home in stable condition after going over discharge instructions with no concerns voiced. AVS given before leaving unit 

## 2018-11-01 ENCOUNTER — Other Ambulatory Visit: Payer: Self-pay | Admitting: Internal Medicine

## 2018-11-01 DIAGNOSIS — E041 Nontoxic single thyroid nodule: Secondary | ICD-10-CM

## 2018-11-04 ENCOUNTER — Other Ambulatory Visit: Payer: PRIVATE HEALTH INSURANCE

## 2018-11-12 ENCOUNTER — Ambulatory Visit
Admission: RE | Admit: 2018-11-12 | Discharge: 2018-11-12 | Disposition: A | Discharge status: Home / Self Care | Payer: PRIVATE HEALTH INSURANCE | Source: Ambulatory Visit | Attending: Internal Medicine | Admitting: Internal Medicine

## 2018-11-12 DIAGNOSIS — E041 Nontoxic single thyroid nodule: Secondary | ICD-10-CM

## 2019-09-12 ENCOUNTER — Other Ambulatory Visit: Payer: Self-pay | Admitting: *Deleted

## 2019-09-12 ENCOUNTER — Telehealth: Payer: Self-pay | Admitting: *Deleted

## 2019-09-12 ENCOUNTER — Other Ambulatory Visit: Payer: Self-pay | Admitting: Internal Medicine

## 2019-09-12 DIAGNOSIS — R1011 Right upper quadrant pain: Secondary | ICD-10-CM

## 2019-09-12 DIAGNOSIS — R002 Palpitations: Secondary | ICD-10-CM

## 2019-09-12 NOTE — Telephone Encounter (Signed)
[employer] to ship a 30 day cardiac event monitor to your home.  Instructions reviewed briefly as they are included in the monitor kit. 

## 2019-09-12 NOTE — Telephone Encounter (Signed)
Attempt to contact patient to inform she has been enrolled for [employer] to ship a 30 day cardiac event monitor to her home. No answer/ No DPR/Mailbox full.

## 2019-09-13 ENCOUNTER — Telehealth: Payer: Self-pay

## 2019-09-13 NOTE — Telephone Encounter (Signed)
NOTES ON FILE FROM KATIE HYATT NP [phone] SENT REFERRAL TO SCHEDULING

## 2019-09-22 ENCOUNTER — Ambulatory Visit
Admission: RE | Admit: 2019-09-22 | Discharge: 2019-09-22 | Disposition: A | Discharge status: Home / Self Care | Payer: PRIVATE HEALTH INSURANCE | Source: Ambulatory Visit | Attending: Internal Medicine | Admitting: Internal Medicine

## 2019-09-22 DIAGNOSIS — R1011 Right upper quadrant pain: Secondary | ICD-10-CM

## 2019-09-26 ENCOUNTER — Ambulatory Visit (INDEPENDENT_AMBULATORY_CARE_PROVIDER_SITE_OTHER): Payer: PRIVATE HEALTH INSURANCE

## 2019-09-26 DIAGNOSIS — R002 Palpitations: Secondary | ICD-10-CM

## 2019-10-06 ENCOUNTER — Telehealth: Payer: Self-pay

## 2019-10-06 NOTE — Telephone Encounter (Signed)
NOTES ON FILE FROM DR Timothy Lasso [phone] ,SENT REFERRAL TO SCHEDULING

## 2019-10-11 ENCOUNTER — Telehealth: Payer: Self-pay | Admitting: Internal Medicine

## 2019-10-11 NOTE — Telephone Encounter (Signed)
Per Kelle Darting, Patient's referral was only for monitor.  The monitor was mailed 12/22.  The monitor will be read and results sent to Dr Timothy Lasso. If at that time, Dr Timothy Lasso feels patient needs to be seen by cardiology, another referral will be sent for [place] to schedule her an appt with providers.  Patient was contacted and made aware of the process

## 2019-11-02 ENCOUNTER — Ambulatory Visit: Payer: PRIVATE HEALTH INSURANCE | Admitting: Cardiovascular Disease

## 2020-04-11 ENCOUNTER — Other Ambulatory Visit: Payer: Self-pay | Admitting: Internal Medicine

## 2020-04-11 DIAGNOSIS — E041 Nontoxic single thyroid nodule: Secondary | ICD-10-CM

## 2020-04-19 ENCOUNTER — Ambulatory Visit
Admission: RE | Admit: 2020-04-19 | Discharge: 2020-04-19 | Disposition: A | Discharge status: Home / Self Care | Payer: PRIVATE HEALTH INSURANCE | Source: Ambulatory Visit | Attending: Internal Medicine | Admitting: Internal Medicine

## 2020-04-19 DIAGNOSIS — E041 Nontoxic single thyroid nodule: Secondary | ICD-10-CM

## 2020-05-06 IMAGING — US US THYROID
1 series · 13 of 25 positions shown · non-contrast
Comparison: None available

CLINICAL DATA: Thyroid nodule

EXAM:
THYROID ULTRASOUND
TECHNIQUE: Ultrasound examination of the thyroid gland and adjacent soft
tissues was performed.

[Series 1: us thyroid · 0.06mm/px · 13 of 43 slices shown]
[im 1/43]
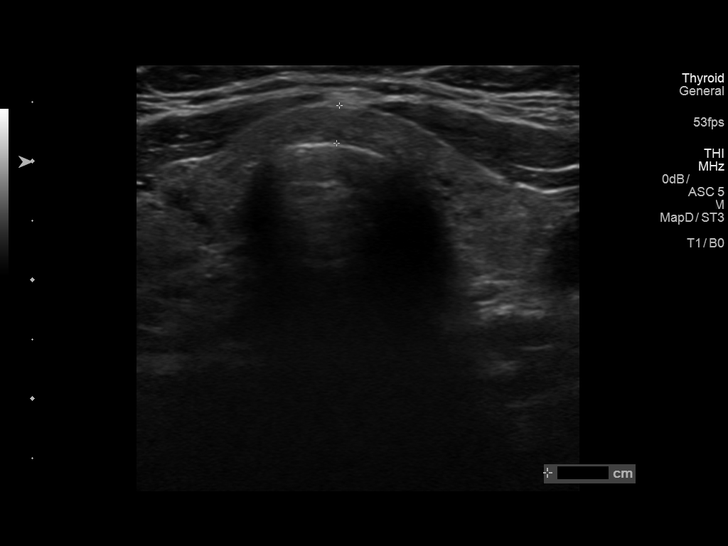
[im 4/43]
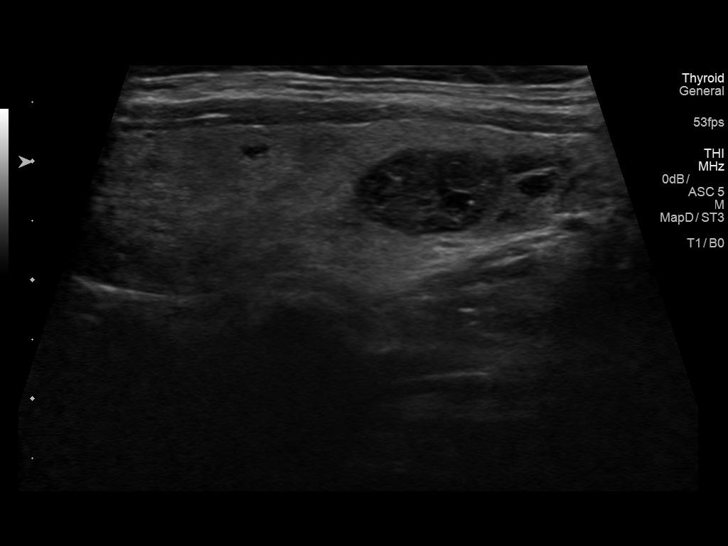
[im 8/43]
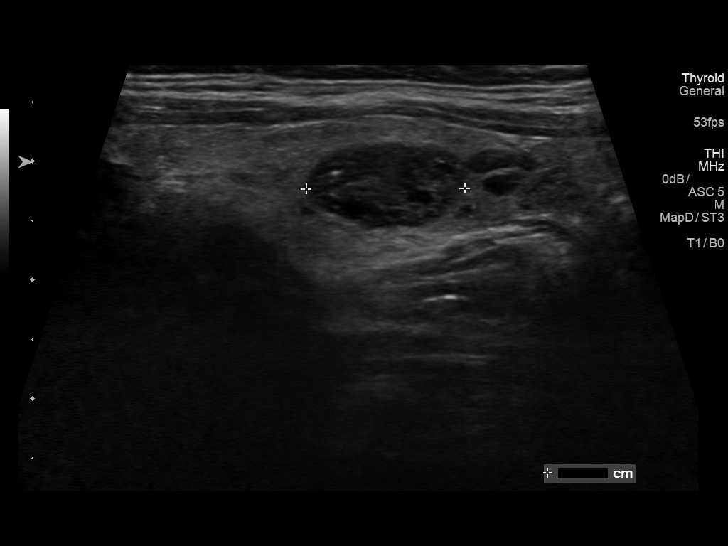
[im 11/43]
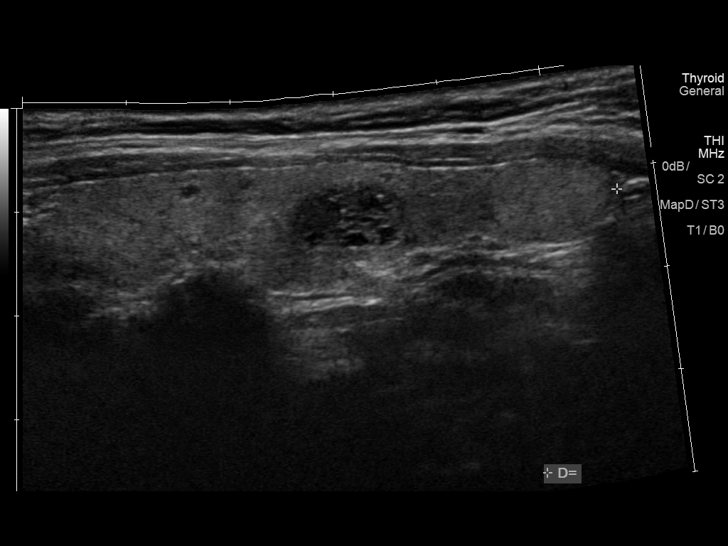
[im 15/43]
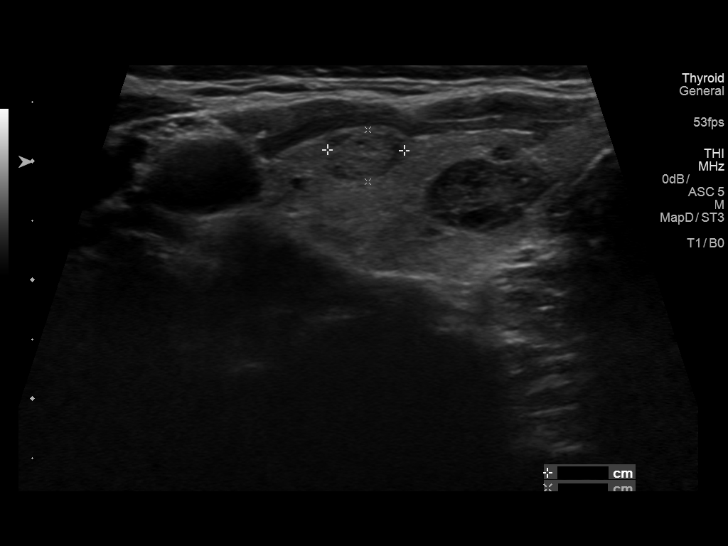
[im 18/43]
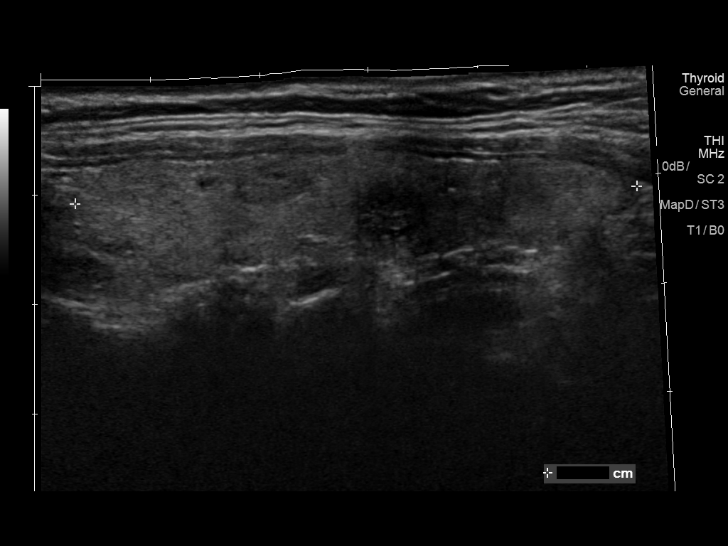
[im 22/43]
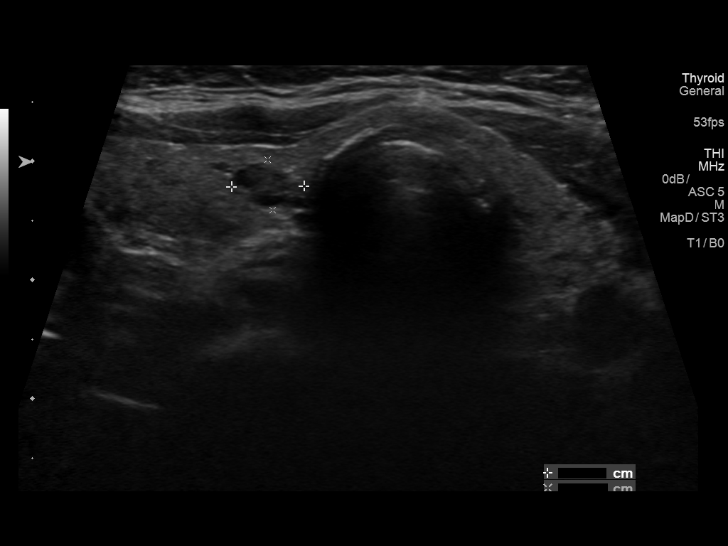
[im 25/43]
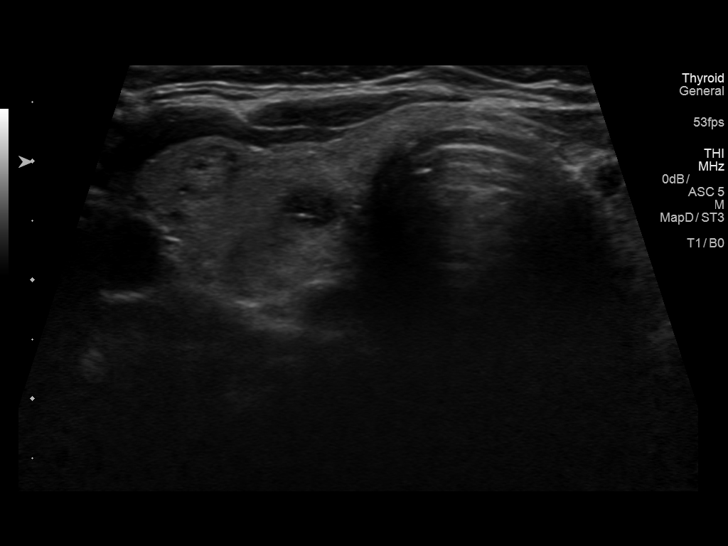
[im 29/43]
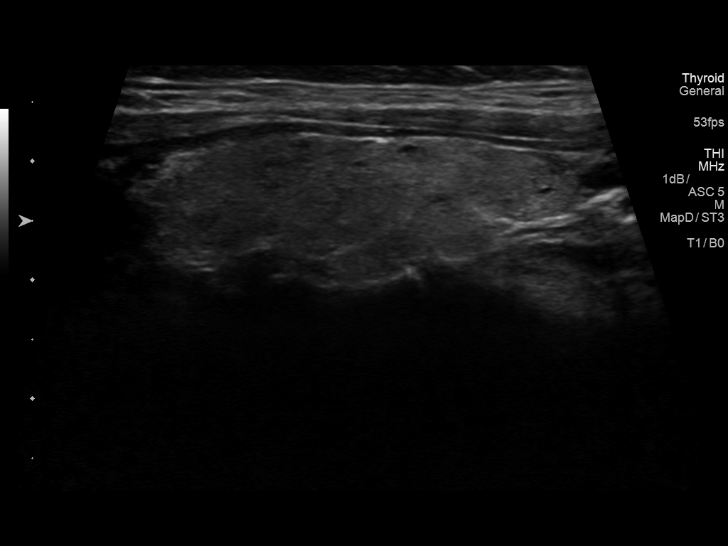
[im 32/43]
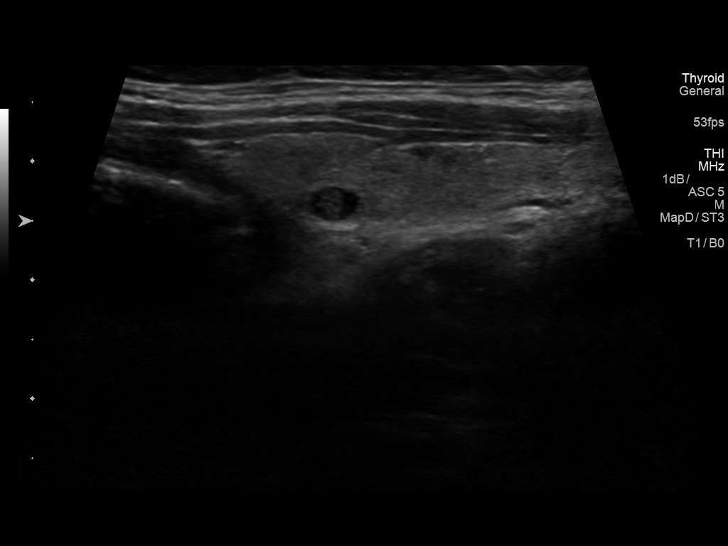
[im 36/43]
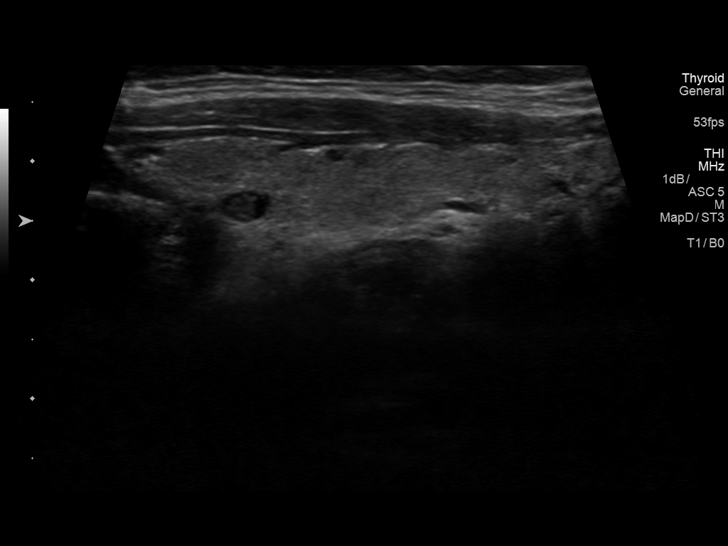
[im 39/43]
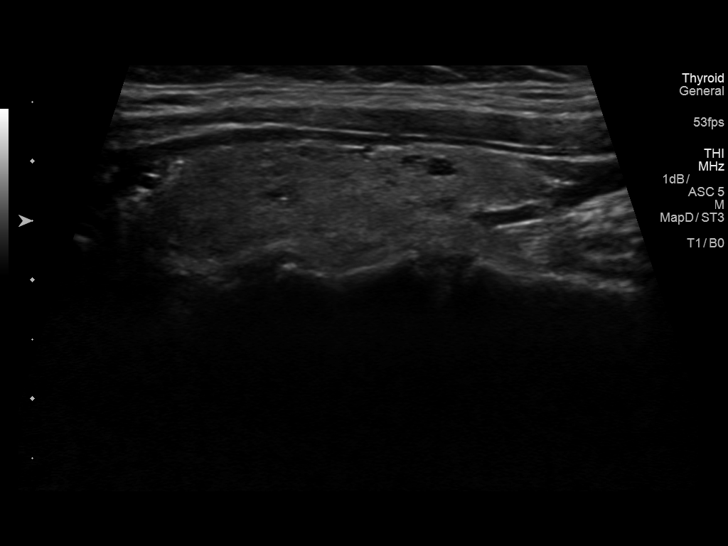
[im 43/43]
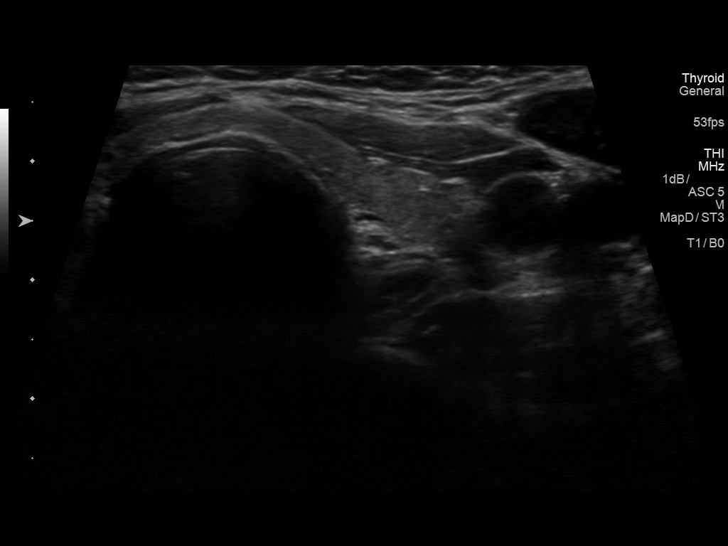

[13 of 25 positions shown; findings below may reference images not displayed]

FINDINGS: Parenchymal Echotexture: Mildly heterogeneous

Isthmus: 3 mm

Right lobe: 5.6 x 1.4 x 2.2 cm

Left lobe: 3.6 x 1.3 x 1.3 cm

_________________________________________________________

Estimated total number of nodules >/= 1 cm: 2

Number of spongiform nodules >/=  2 cm not described below (TR1): 0

Number of mixed cystic and solid nodules >/= 1.5 cm not described
below (TR2): 0

_________________________________________________________

Nodule # 2:

Location: Right; Mid

Maximum size: 1.3 cm; Other 2 dimensions: 0.7 x 1.0 cm

Composition: solid/almost completely solid (2)

Echogenicity: hypoechoic (2)

Shape: not taller-than-wide (0)

Margins: smooth (0)

Echogenic foci: none (0)

ACR TI-RADS total points: 4.

ACR TI-RADS risk category: TR4 (4-6 points).

ACR TI-RADS recommendations:

*Given size (>/= 1 - 1.4 cm) and appearance, a follow-up ultrasound
in 1 year should be considered based on TI-RADS criteria.

_________________________________________________________

Nodule # 4:

Location: Right; Inferior

Maximum size: 1.4 cm; Other 2 dimensions: 0.9 x 1.0 cm

Composition: solid/almost completely solid (2)

Echogenicity: isoechoic (1)

Shape: not taller-than-wide (0)

Margins: smooth (0)

Echogenic foci: none (0)

ACR TI-RADS total points: 3.

ACR TI-RADS risk category: TR3 (3 points).

ACR TI-RADS recommendations:

Given size (<1.4 cm) and appearance, this nodule does NOT meet
TI-RADS criteria for biopsy or dedicated follow-up.

_________________________________________________________

There are additional subcentimeter isoechoic, hypoechoic, and
partially cystic nodules bilaterally which all measure 9 mm or less
in size. These would not meet criteria for any biopsy or follow-up.

Normal vascularity.

No adenopathy.
IMPRESSION: 1.3 cm right mid thyroid TR 4 nodule meets criteria follow-up in 1
year.

1.4 cm right inferior TR 3 nodule does not meet criteria for biopsy
or follow-up.

The above is in keeping with the ACR TI-RADS recommendations - [HOSPITAL] 6619;[DATE].

## 2020-05-24 ENCOUNTER — Encounter: Payer: Self-pay | Admitting: Oncology

## 2020-05-24 ENCOUNTER — Telehealth: Payer: Self-pay | Admitting: Oncology

## 2020-05-24 ENCOUNTER — Other Ambulatory Visit: Payer: Self-pay | Admitting: Oncology

## 2020-05-24 DIAGNOSIS — U071 COVID-19: Secondary | ICD-10-CM

## 2020-05-24 NOTE — Progress Notes (Signed)
I connected by phone with  Mrs. Bellavance to discuss the potential use of an new treatment for mild to moderate COVID-19 viral infection in non-hospitalized patients.   This patient is a age/sex that meets the FDA criteria for Emergency Use Authorization of casirivimab\imdevimab.  Has a (+) direct SARS-CoV-2 viral test result 1. Has mild or moderate COVID-19  2. Is ? 57 years of age and weighs ? 40 kg 3. Is NOT hospitalized due to COVID-19 4. Is NOT requiring oxygen therapy or requiring an increase in baseline oxygen flow rate due to COVID-19 5. Is within 10 days of symptom onset 6. Has at least one of the high risk factor(s) for progression to severe COVID-19 and/or hospitalization as defined in EUA. ? Specific high risk criteria :Obesity    Symptom onset  05/20/20   I have spoken and communicated the following to the patient or parent/caregiver:   1. FDA has authorized the emergency use of casirivimab\imdevimab for the treatment of mild to moderate COVID-19 in adults and pediatric patients with positive results of direct SARS-CoV-2 viral testing who are 10 years of age and older weighing at least 40 kg, and who are at high risk for progressing to severe COVID-19 and/or hospitalization.   2. The significant known and potential risks and benefits of casirivimab\imdevimab, and the extent to which such potential risks and benefits are unknown.   3. Information on available alternative treatments and the risks and benefits of those alternatives, including clinical trials.   4. Patients treated with casirivimab\imdevimab should continue to self-isolate and use infection control measures (e.g., wear mask, isolate, social distance, avoid sharing personal items, clean and disinfect "high touch" surfaces, and frequent handwashing) according to CDC guidelines.    5. The patient or parent/caregiver has the option to accept or refuse casirivimab\imdevimab .   After reviewing this information with the  patient, The patient agreed to proceed with receiving casirivimab\imdevimab infusion and will be provided a copy of the Fact sheet prior to receiving the infusion.Mignon Pine, AGNP-C [phone] (Infusion Center Hotline)

## 2020-05-25 ENCOUNTER — Ambulatory Visit (HOSPITAL_COMMUNITY)
Admission: RE | Admit: 2020-05-25 | Discharge: 2020-05-25 | Disposition: A | Discharge status: Home / Self Care | Payer: PRIVATE HEALTH INSURANCE | Source: Ambulatory Visit | Attending: Pulmonary Disease | Admitting: Pulmonary Disease

## 2020-05-25 DIAGNOSIS — U071 COVID-19: Secondary | ICD-10-CM | POA: Insufficient documentation

## 2020-05-25 MED ORDER — FAMOTIDINE IN NACL 20-0.9 MG/50ML-% IV SOLN: 20.0000 mg | Freq: Once | INTRAVENOUS | Status: DC | PRN

## 2020-05-25 MED ORDER — EPINEPHRINE 0.3 MG/0.3ML IJ SOAJ: 0.3000 mg | Freq: Once | INTRAMUSCULAR | Status: DC | PRN

## 2020-05-25 MED ORDER — ALBUTEROL SULFATE HFA 108 (90 BASE) MCG/ACT IN AERS: 2.0000 | INHALATION_SPRAY | Freq: Once | RESPIRATORY_TRACT | Status: DC | PRN

## 2020-05-25 MED ORDER — SODIUM CHLORIDE 0.9 % IV SOLN
1200.0000 mg | Freq: Once | INTRAVENOUS | Status: AC
  Administered 2020-05-25: 1200 mg via INTRAVENOUS
  Filled 2020-05-25: qty 10

## 2020-05-25 MED ORDER — ACETAMINOPHEN 325 MG PO TABS
650.0000 mg | ORAL_TABLET | ORAL | Status: AC
  Administered 2020-05-25: 650 mg via ORAL
  Filled 2020-05-25: qty 2

## 2020-05-25 MED ORDER — SODIUM CHLORIDE 0.9 % IV SOLN: INTRAVENOUS | Status: DC | PRN

## 2020-05-25 MED ORDER — METHYLPREDNISOLONE SODIUM SUCC 125 MG IJ SOLR: 125.0000 mg | Freq: Once | INTRAMUSCULAR | Status: DC | PRN

## 2020-05-25 MED ORDER — DIPHENHYDRAMINE HCL 50 MG/ML IJ SOLN: 50.0000 mg | Freq: Once | INTRAMUSCULAR | Status: DC | PRN

## 2020-05-25 NOTE — Progress Notes (Signed)
  Diagnosis: COVID-19  Physician:DR Delford Field  Procedure: [facility] Med: casirivimab\imdevimab infusion - Provided patient with casirivimab\imdevimab fact sheet for patients, parents and caregivers prior to infusion.  Complications: No immediate complications noted.  Discharge: Discharged home   Christine [name] 05/25/2020 Pt T 103.5,Tylenol 650mg  given, rechecked Temp.-103F. PA aware and okay patient to go home.

## 2020-05-25 NOTE — Discharge Instructions (Signed)

## 2020-06-08 NOTE — Telephone Encounter (Signed)
error 

## 2021-01-28 ENCOUNTER — Other Ambulatory Visit: Payer: Self-pay | Admitting: Internal Medicine

## 2021-01-28 DIAGNOSIS — E041 Nontoxic single thyroid nodule: Secondary | ICD-10-CM

## 2021-08-12 IMAGING — US US ABDOMEN LIMITED
1 series · 14 of 25 positions shown · non-contrast
Comparison: Abdominal ultrasound January 05, 2012; CT abdomen and
pelvis April 24, 2018

CLINICAL DATA: Upper abdominal pain right upper quadrant pain

EXAM:
ULTRASOUND ABDOMEN LIMITED RIGHT UPPER QUADRANT

[Series 1: us abdomen limited · 0.14mm/px · 14 of 49 slices shown]
[im 1/49]
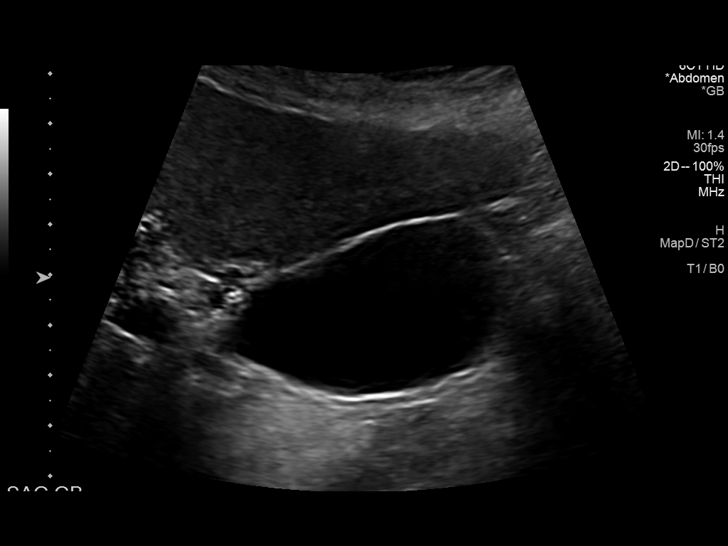
[im 5/49]
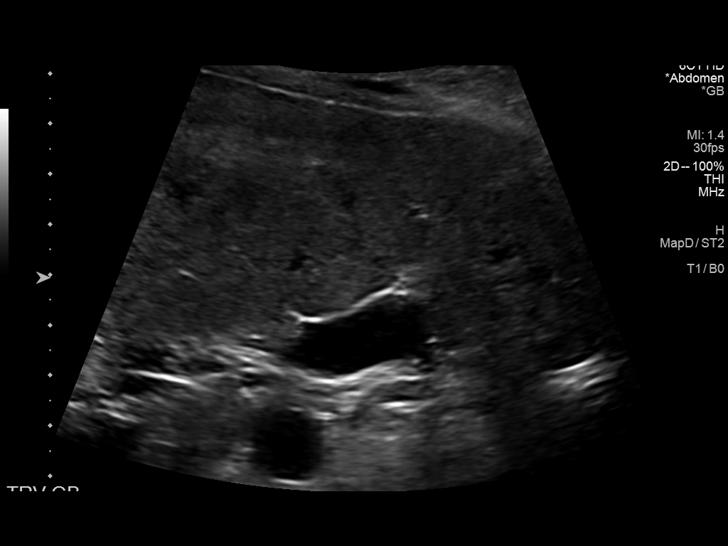
[im 9/49]
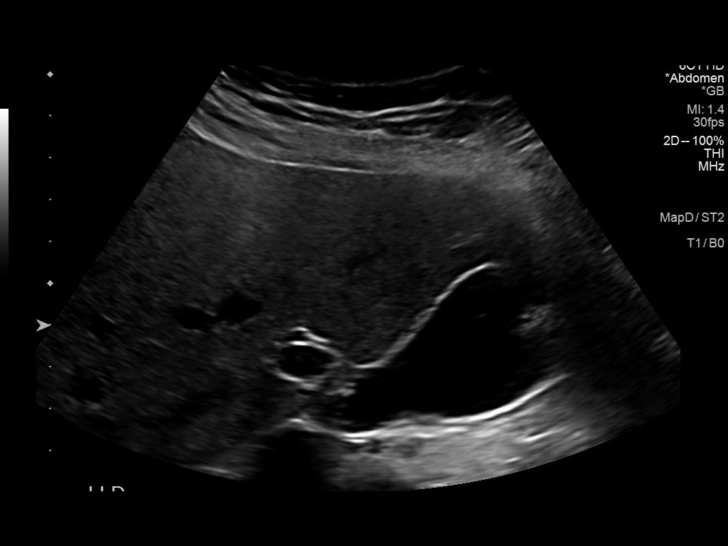
[im 13/49]
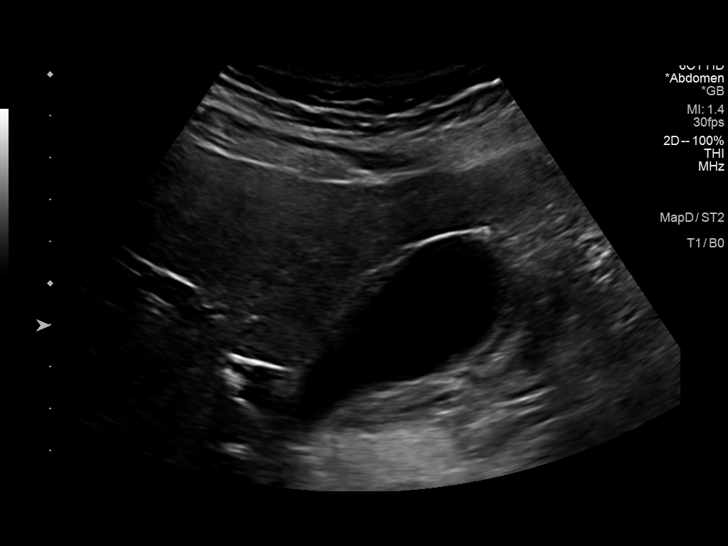
[im 17/49]
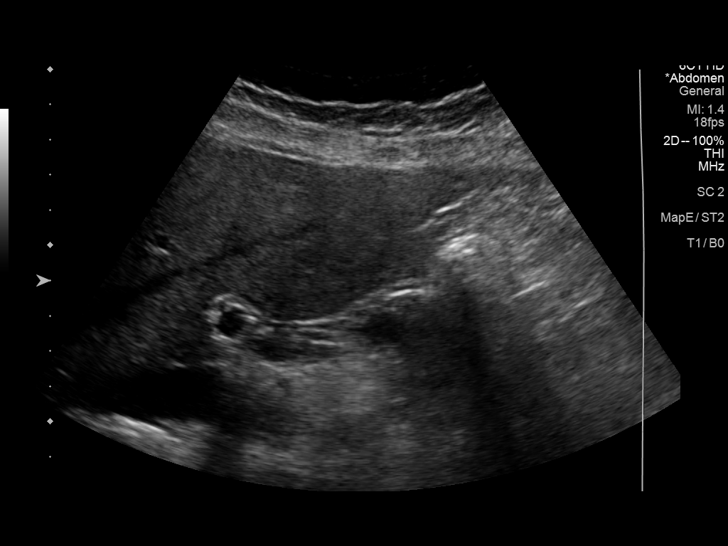
[im 19/49]
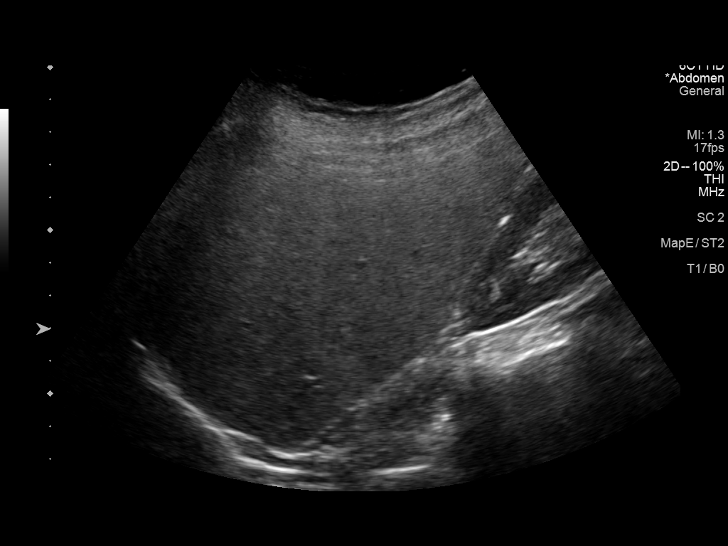
[im 23/49]
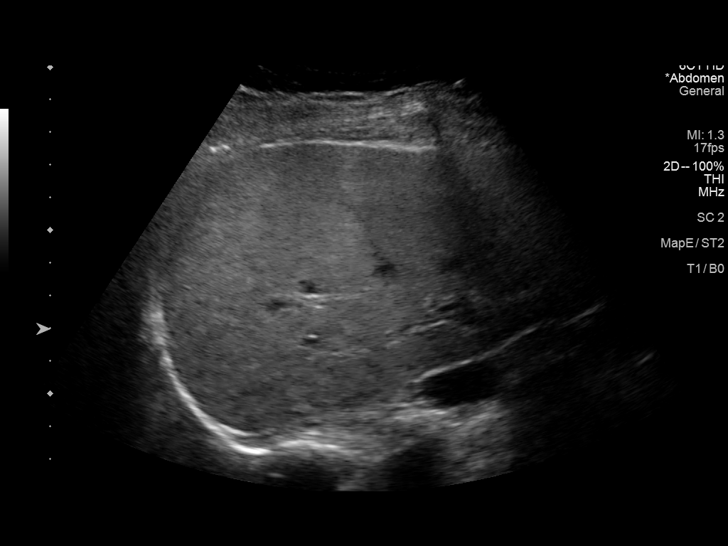
[im 27/49]
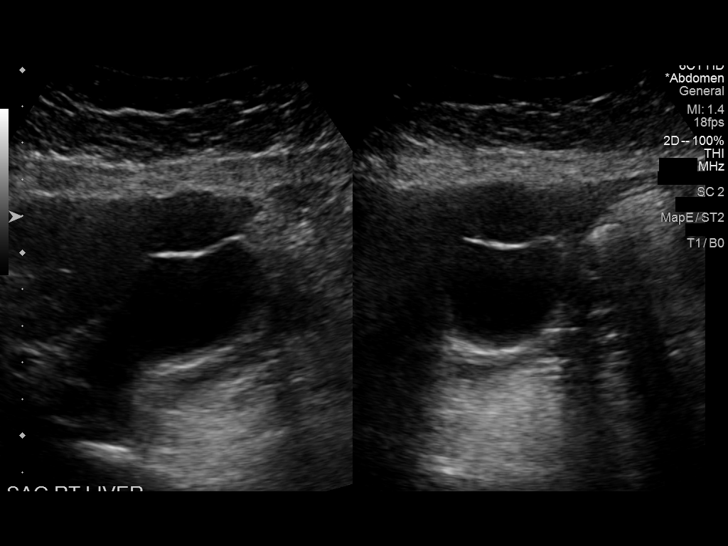
[im 31/49]
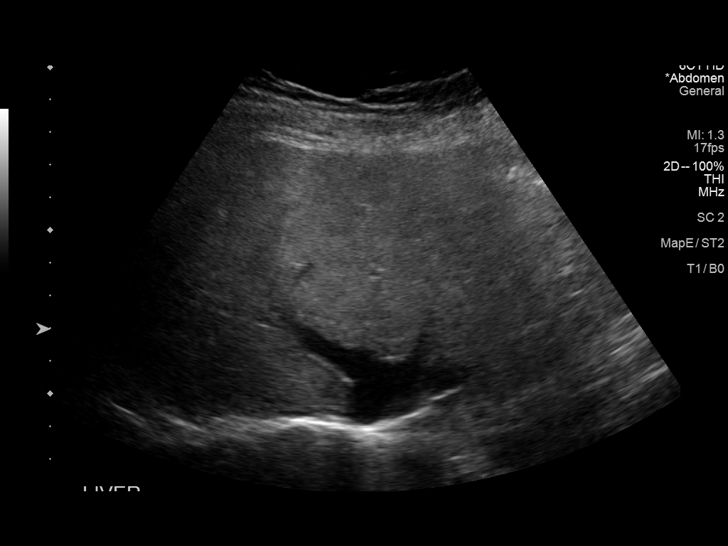
[im 33/49]
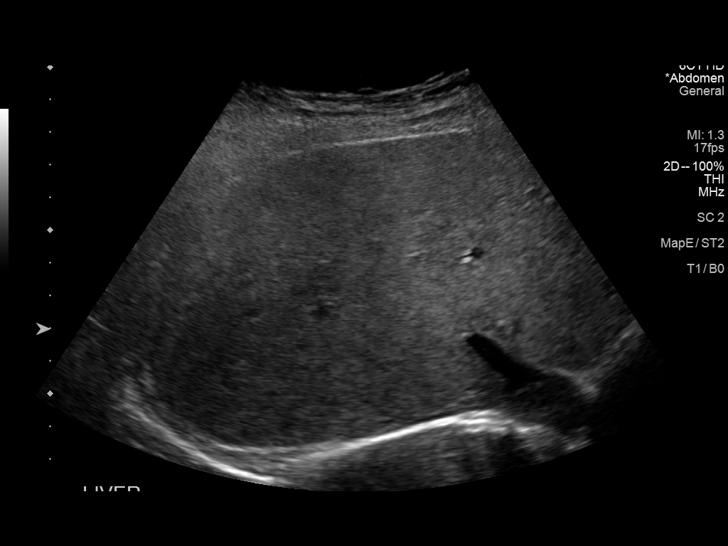
[im 37/49]
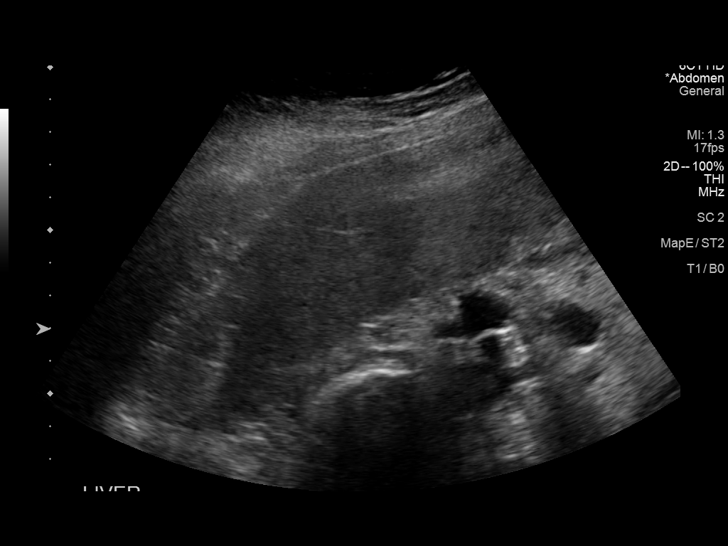
[im 41/49]
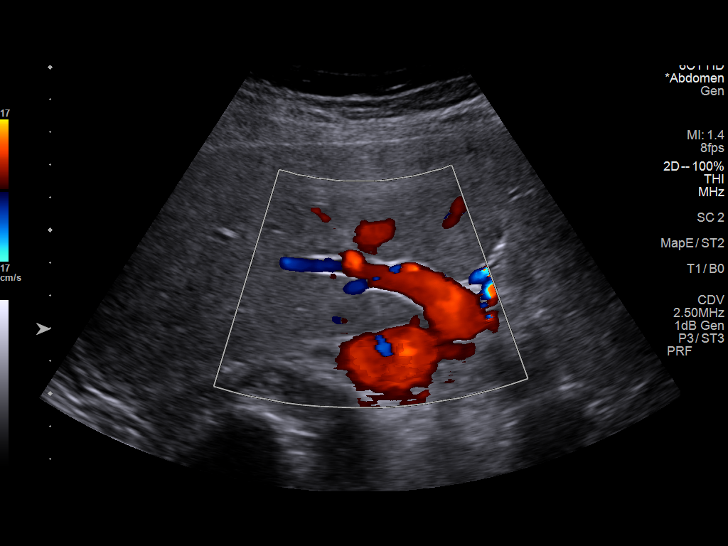
[im 45/49]
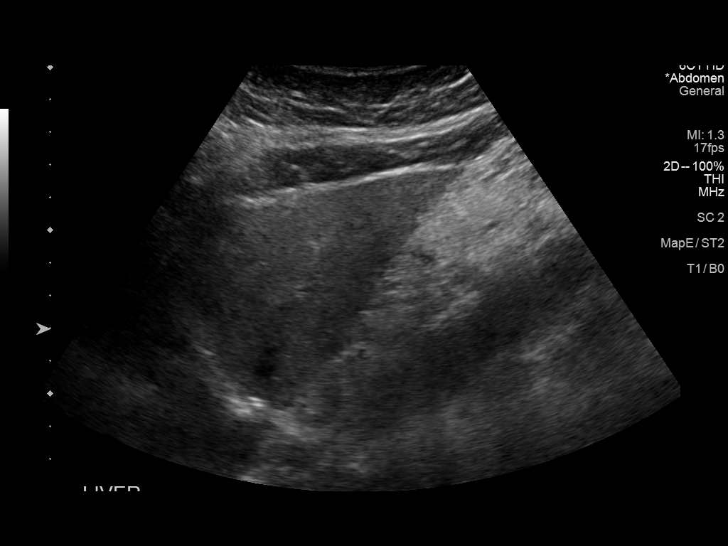
[im 49/49]
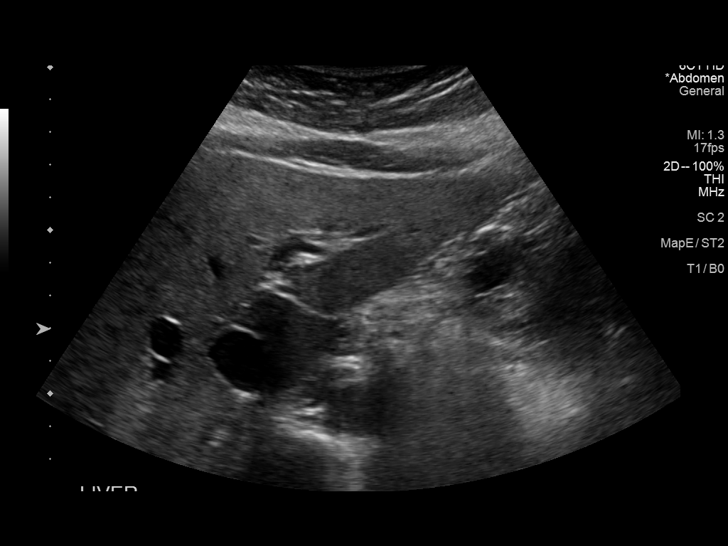

[14 of 25 positions shown; findings below may reference images not displayed]

FINDINGS: Gallbladder:

No gallstones or wall thickening visualized. There is no
pericholecystic fluid. No sonographic Murphy sign noted by
sonographer.

Common bile duct:

Diameter: 4 mm. No intrahepatic or extrahepatic biliary duct
dilatation.

Liver:

Cyst again noted in the right lobe of the liver anteriorly measuring
1.9 x 1.6 x 2.2 cm. The liver echogenicity overall is mildly
increased. Within normal limits in parenchymal echogenicity. Portal
vein is patent on color Doppler imaging with normal direction of
blood flow towards the liver.

Other: None.
IMPRESSION: 1. Mild increase in liver echogenicity, a finding felt to represent
a degree of hepatic steatosis.

2. Cyst again note anterior aspect of the right lobe of the liver.
No new liver lesions appreciable by ultrasound.

3.  Study otherwise unremarkable.

## 2021-12-12 ENCOUNTER — Other Ambulatory Visit: Payer: Self-pay | Admitting: Internal Medicine

## 2021-12-12 DIAGNOSIS — E781 Pure hyperglyceridemia: Secondary | ICD-10-CM

## 2021-12-24 NOTE — Progress Notes (Deleted)
? ? ?Christine Likens, MD ?Reason for referral-palpitations ? ?HPI: 61 year old female for evaluation of palpitations at request of Shon Baton, MD.  Echocardiogram September 2018 showed normal LV function, mild mitral regurgitation.  Monitor February 2021 showed sinus rhythm with PVCs.  Laboratories March 2023 showed hemoglobin 15.4, potassium 4.1, BUN 15, creatinine 0.88, magnesium 2.3. ? ?Current Outpatient Medications  ?Medication Sig Dispense Refill  ? ALPRAZolam (XANAX) 0.25 MG tablet Take 0.125-0.25 mg by mouth daily as needed for sleep or anxiety.     ? cetirizine (ZYRTEC) 10 MG tablet Take 10 mg by mouth at bedtime as needed for allergies or rhinitis.     ? HYDROcodone-acetaminophen (NORCO) 5-325 MG tablet Take 1-2 tablets by mouth every 6 (six) hours as needed for moderate pain or severe pain. 20 tablet 0  ? ibuprofen (ADVIL,MOTRIN) 200 MG tablet Take 400 mg by mouth 2 (two) times daily as needed for headache.     ? oxyCODONE-acetaminophen (PERCOCET) 7.5-325 MG per tablet Take 1 tablet by mouth every 4 (four) hours as needed for pain. (Patient not taking: Reported on 12/25/2017) 15 tablet 0  ? Polyethyl Glycol-Propyl Glycol (SYSTANE ULTRA OP) Place 1 drop into both eyes 2 (two) times daily as needed (dry eyes).    ? ?No current facility-administered medications for this visit.  ? ? ?Allergies  ?Allergen Reactions  ? Other Nausea And Vomiting  ?  Threw up after anesthesia (specific one not recalled)  ? Neosporin [Neomycin-Bacitracin Zn-Polymyx] Other (See Comments)  ?  Redness after several days of exposure  ? Benzalkonium Chloride Other (See Comments)  ?  Redness  ? Thimerosal Other (See Comments)  ?  Red and irritated eyes  ? ? ? ?Past Medical History:  ?Diagnosis Date  ? MVP (mitral valve prolapse)   ? NO PROBLEMS  ? PONV (postoperative nausea and vomiting)   ? ? ?Past Surgical History:  ?Procedure Laterality Date  ? DIAGNOSTIC LAPAROSCOPY  1997  ? infertility  ? DILATATION & CURRETTAGE/HYSTEROSCOPY  WITH RESECTOCOPE N/A 02/10/2013  ? Procedure: DILATATION & CURETTAGE/HYSTEROSCOPY WITH RESECTOCOPE;  Surgeon: Darlyn Chamber, MD;  Location: [facility];  Service: Gynecology;  Laterality: N/A;  ? [place] OF UTERUS  2009  ? mas at 55[place]-vag del but also needed d & c  ? infertility    ? ivf treatment  ? IR GENERIC HISTORICAL  04/29/2016  ? IR RADIOLOGIST EVAL & MGMT 04/29/2016 [facility]  ? LAPAROSCOPIC APPENDECTOMY N/A 12/25/2017  ? Procedure: APPENDECTOMY LAPAROSCOPIC;  Surgeon: Leighton Ruff, MD;  Location: [facility];  Service: General;  Laterality: N/A;  ? TUBAL LIGATION  2009  ? WISDOM TOOTH EXTRACTION    ? ? ?Social History  ? ?Socioeconomic History  ? Marital status: Married  ?  Spouse name: Not on file  ? Number of children: Not on file  ? Years of education: Not on file  ? Highest education level: Not on file  ?Occupational History  ? Not on file  ?Tobacco Use  ? Smoking status: Never  ? Smokeless tobacco: Never  ?Substance and Sexual Activity  ? Alcohol use: Yes  ?  Comment: SOCIALLY  ? Drug use: No  ? Sexual activity: Yes  ?  Birth control/protection: Surgical  ?Other Topics Concern  ? Not on file  ?Social History Narrative  ? Not on file  ? ?Social Determinants of Health  ? ?Financial Resource Strain: Not on file  ?Food Insecurity: Not on file  ?Transportation Needs: Not on file  ?  Physical Activity: Not on file  ?Stress: Not on file  ?Social Connections: Not on file  ?Intimate Partner Violence: Not on file  ? ? ?No family history on file. ? ?ROS: no fevers or chills, productive cough, hemoptysis, dysphasia, odynophagia, melena, hematochezia, dysuria, hematuria, rash, seizure activity, orthopnea, PND, pedal edema, claudication. Remaining systems are negative. ? ?Physical Exam:  ? ?There were no vitals taken for this visit. ? ?General:  Well developed/well nourished in NAD ?Skin warm/dry ?Patient not depressed ?No peripheral clubbing ?Back-normal ?HEENT-normal/normal eyelids ?Neck supple/normal carotid  upstroke bilaterally; no bruits; no JVD; no thyromegaly ?chest - CTA/ normal expansion ?CV - RRR/normal S1 and S2; no murmurs, rubs or gallops;  PMI nondisplaced ?Abdomen -NT/ND, no HSM, no mass, + bowel sounds, no bruit ?2+ femoral pulses, no bruits ?Ext-no edema, chords, 2+ DP ?Neuro-grossly nonfocal ? ?ECG - personally reviewed ? ?A/P ? ?1 palpitations- ? ?2 MVP- ? ?Kirk Ruths, MD ? ?

## 2021-12-25 ENCOUNTER — Ambulatory Visit: Payer: PRIVATE HEALTH INSURANCE | Admitting: Cardiology

## 2022-01-12 NOTE — Progress Notes (Signed)
? ? ?Chief Complaint  ?Patient presents with  ? New Patient (Initial Visit)  ?  palpitations  ? ?History of Present Illness: 61 yo female with history of mitral valve prolapse with mild MR by echo at [facility] in 2018 and palpitations here today as a new patient for the evaluation of palpitations. She tells me today that she has had feelings of her heart skipping at night. She notices this mostly at night. Cardiac monitor in 2021 with sinus with PVCs. Echo in 2018 at [facility] with normal Lv function and mild MR. She snores at night and feels that she may have sleep apnea. Some daytime somnolence. She was recently found to have a hiatal hernia.  ? ?Primary Care Physician: Creola Corn, MD ? ?Past Medical History:  ?Diagnosis Date  ? Chest pain   ? Hiatal hernia   ? MVP (mitral valve prolapse)   ? NO PROBLEMS  ? Palpitations   ? PONV (postoperative nausea and vomiting)   ? ? ?Past Surgical History:  ?Procedure Laterality Date  ? DIAGNOSTIC LAPAROSCOPY  1997  ? infertility  ? DILATATION & CURRETTAGE/HYSTEROSCOPY WITH RESECTOCOPE N/A 02/10/2013  ? Procedure: DILATATION & CURETTAGE/HYSTEROSCOPY WITH RESECTOCOPE;  Surgeon: Juluis Mire, MD;  Location: [facility];  Service: Gynecology;  Laterality: N/A;  ? DILATION AND CURETTAGE OF UTERUS  2009  ? mas at 28mons-vag del but also needed d & c  ? infertility    ? ivf treatment  ? IR GENERIC HISTORICAL  04/29/2016  ? IR RADIOLOGIST EVAL & MGMT 04/29/2016 [facility] RAD  ? LAPAROSCOPIC APPENDECTOMY N/A 12/25/2017  ? Procedure: APPENDECTOMY LAPAROSCOPIC;  Surgeon: Romie Levee, MD;  Location: [facility] OR;  Service: General;  Laterality: N/A;  ? TUBAL LIGATION  2009  ? WISDOM TOOTH EXTRACTION    ? ? ?Current Outpatient Medications  ?Medication Sig Dispense Refill  ? ALPRAZolam (XANAX) 0.25 MG tablet Take 0.125-0.25 mg by mouth daily as needed for sleep or anxiety.     ? Collagen Hydrolysate, Bovine, POWD collagen (bovine)    ? ibuprofen (ADVIL,MOTRIN) 200 MG tablet Take 400 mg by mouth 2 (two)  times daily as needed for headache.     ? Polyethyl Glycol-Propyl Glycol (SYSTANE ULTRA OP) Place 1 drop into both eyes 2 (two) times daily as needed (dry eyes).    ? cetirizine (ZYRTEC) 10 MG tablet Take 10 mg by mouth at bedtime as needed for allergies or rhinitis.  (Patient not taking: Reported on 01/13/2022)    ? HYDROcodone-acetaminophen (NORCO) 5-325 MG tablet Take 1-2 tablets by mouth every 6 (six) hours as needed for moderate pain or severe pain. (Patient not taking: Reported on 01/13/2022) 20 tablet 0  ? oxyCODONE-acetaminophen (PERCOCET) 7.5-325 MG per tablet Take 1 tablet by mouth every 4 (four) hours as needed for pain. (Patient not taking: Reported on 12/25/2017) 15 tablet 0  ? ?No current facility-administered medications for this visit.  ? ? ?Allergies  ?Allergen Reactions  ? Other Nausea And Vomiting  ?  Threw up after anesthesia (specific one not recalled)  ? Neosporin [Neomycin-Bacitracin Zn-Polymyx] Other (See Comments)  ?  Redness after several days of exposure  ? Benzalkonium Chloride Other (See Comments)  ?  Redness  ? Thimerosal Other (See Comments)  ?  Red and irritated eyes  ? ? ?Social History  ? ?Socioeconomic History  ? Marital status: Married  ?  Spouse name: Not on file  ? Number of children: Not on file  ? Years of education: Not on  file  ? Highest education level: Not on file  ?Occupational History  ? Occupation: Works at [employer]  ?Tobacco Use  ? Smoking status: Never  ? Smokeless tobacco: Never  ?Substance and Sexual Activity  ? Alcohol use: Yes  ?  Comment: SOCIALLY  ? Drug use: No  ? Sexual activity: Yes  ?  Birth control/protection: Surgical  ?Other Topics Concern  ? Not on file  ?Social History Narrative  ? Not on file  ? ?Social Determinants of Health  ? ?Financial Resource Strain: Not on file  ?Food Insecurity: Not on file  ?Transportation Needs: Not on file  ?Physical Activity: Not on file  ?Stress: Not on file  ?Social Connections: Not on file  ?Intimate Partner Violence:  Not on file  ? ? ?Family History  ?Problem Relation Age of Onset  ? CAD Mother   ? Cancer - Colon Father   ? ? ?Review of Systems:  As stated in the HPI and otherwise negative.  ? ?BP 120/78   Pulse 62   Ht 5' 4.5" (1.638 m)   Wt 171 lb 9.6 oz (77.8 kg)   SpO2 98%   BMI 29.00 kg/m?  ? ?Physical Examination: ?General: Well developed, well nourished, NAD  ?HEENT: OP clear, mucus membranes moist  ?SKIN: warm, dry. No rashes. ?Neuro: No focal deficits  ?Musculoskeletal: Muscle strength 5/5 all ext  ?Psychiatric: Mood and affect normal  ?Neck: No JVD, no carotid bruits, no thyromegaly, no lymphadenopathy.  ?Lungs:Clear bilaterally, no wheezes, rhonci, crackles ?Cardiovascular: Regular rate and rhythm. No murmurs, gallops or rubs. ?Abdomen:Soft. Bowel sounds present. Non-tender.  ?Extremities: No lower extremity edema. Pulses are 2 + in the bilateral DP/PT. ? ?EKG:  EKG is ordered today. ?The ekg ordered today demonstrates sinus ? ?Recent Labs: ?No results found for requested labs within last 8760 hours.  ? ?Lipid Panel ?No results found for: CHOL, TRIG, HDL, CHOLHDL, VLDL, LDLCALC, LDLDIRECT ?  ?Wt Readings from Last 3 Encounters:  ?01/13/22 171 lb 9.6 oz (77.8 kg)  ?12/25/17 183 lb 13.8 oz (83.4 kg)  ?11/09/14 159 lb (72.1 kg)  ?  ? ? ?Assessment and Plan:  ? ?1. Palpitations: Likely due to PVCs. Will arrange a 3 day Zio cardiac monitor ? ?2. Snoring/Daytime somnolence: Will refer for a sleep study ? ?3. Mitral regurgitation: Mild by echo in 2018. Will repeat echo now.  ? ?Labs/ tests ordered today include:  ? ?Orders Placed This Encounter  ?Procedures  ? LONG TERM MONITOR (3-14 DAYS)  ? EKG 12-Lead  ? ECHOCARDIOGRAM COMPLETE  ? Split night study  ? ? ? ?Disposition:   F/U with me in 4-8 weeks ? ? ?Signed, ?Verne Carrow, MD ?01/13/2022 9:57 AM    ?[facility] ?[address] [place], [place], [place]  [address] ?Phone: [phone]; Fax: [phone]  ? ? ?

## 2022-01-13 ENCOUNTER — Ambulatory Visit: Payer: PRIVATE HEALTH INSURANCE | Admitting: Cardiovascular Disease

## 2022-01-13 ENCOUNTER — Ambulatory Visit (INDEPENDENT_AMBULATORY_CARE_PROVIDER_SITE_OTHER): Payer: PRIVATE HEALTH INSURANCE

## 2022-01-13 ENCOUNTER — Encounter: Payer: Self-pay | Admitting: Cardiovascular Disease

## 2022-01-13 VITALS — BP 120/78 | HR 62 | Ht 64.5 in | Wt 171.6 lb

## 2022-01-13 DIAGNOSIS — I340 Nonrheumatic mitral (valve) insufficiency: Secondary | ICD-10-CM

## 2022-01-13 DIAGNOSIS — R002 Palpitations: Secondary | ICD-10-CM | POA: Diagnosis not present

## 2022-01-13 DIAGNOSIS — R400 Somnolence: Secondary | ICD-10-CM

## 2022-01-13 NOTE — Progress Notes (Unsigned)
[id number] zio xt from office inventory applied to patient. ?

## 2022-01-13 NOTE — Patient Instructions (Signed)
Medication Instructions:  ?No changes ?*If you need a refill on your cardiac medications before your next appointment, please call your pharmacy* ? ? ?Lab Work: ?none ?If you have labs (blood work) drawn today and your tests are completely normal, you will receive your results only by: ?MyChart Message (if you have MyChart) OR ?A paper copy in the mail ?If you have any lab test that is abnormal or we need to change your treatment, we will call you to review the results. ? ? ?Testing/Procedures: ?Your physician has requested that you have an echocardiogram. Echocardiography is a painless test that uses sound waves to create images of your heart. It provides your doctor with information about the size and shape of your heart and how well your heart?s chambers and valves are working. This procedure takes approximately one hour. There are no restrictions for this procedure. ? ?3 Day Zio Heart Monitor - see instructions below. ? ? ?Follow-Up: ?At [facility], you and your health needs are our priority.  As part of our continuing mission to provide you with exceptional heart care, we have created designated Provider Care Teams.  These Care Teams include your primary Cardiologist (physician) and Advanced Practice Providers (APPs -  Physician Assistants and Nurse Practitioners) who all work together to provide you with the care you need, when you need it. ? ? ?Your next appointment:   ?6 week(s) ? ?The format for your next appointment:   ?In Person ? ?Provider:   ?Lauree Chandler, MD   ?OR ADVANCED PRACTICE PROVIDER  ? ? ?Other Instructions ?ZIO XT- Long Term Monitor Instructions ? ?Your physician has requested you wear a ZIO patch monitor for 3 days.  ?This is a single patch monitor. [employer] supplies one patch monitor per enrollment. Additional ?stickers are not available. Please do not apply patch if you will be having a Nuclear Stress Test,  ?Echocardiogram, Cardiac CT, MRI, or Chest Xray during the period you  would be wearing the  ?monitor. The patch cannot be worn during these tests. You cannot remove and re-apply the  ?ZIO XT patch monitor.  ?Your ZIO patch monitor will be mailed 3 day USPS to your address on file. It may take 3-5 days  ?to receive your monitor after you have been enrolled.  ?Once you have received your monitor, please review the enclosed instructions. Your monitor  ?has already been registered assigning a specific monitor serial # to you. ? ?Billing and Patient Assistance Program Information ? ?We have supplied [employer] with any of your insurance information on file for billing purposes. ?[employer] offers a sliding scale Patient Assistance Program for patients that do not have  ?insurance, or whose insurance does not completely cover the cost of the ZIO monitor.  ?You must apply for the Patient Assistance Program to qualify for this discounted rate.  ?To apply, please call [employer] at [phone], select option 4, select option 2, ask to apply for  ?Patient Assistance Program. Theodore Demark will ask your household income, and how many people  ?are in your household. They will quote your out-of-pocket cost based on that information.  ?[employer] will also be able to set up a 57-month, interest-free payment plan if needed. ? ?Applying the monitor ?  ?Shave hair from upper left chest.  ?Hold abrader disc by orange tab. Rub abrader in 40 strokes over the upper left chest as  ?indicated in your monitor instructions.  ?Clean area with 4 enclosed alcohol pads. Let dry.  ?Apply patch as indicated in  monitor instructions. Patch will be placed under collarbone on left  ?side of chest with arrow pointing upward.  ?Rub patch adhesive wings for 2 minutes. Remove white label marked "1". Remove the white  ?label marked "2". Rub patch adhesive wings for 2 additional minutes.  ?While looking in a mirror, press and release button in center of patch. A small green light will  ?flash 3-4 times. This will be your only indicator that  the monitor has been turned on.  ?Do not shower for the first 24 hours. You may shower after the first 24 hours.  ?Press the button if you feel a symptom. You will hear a small click. Record Date, Time and  ?Symptom in the Patient Logbook.  ?When you are ready to remove the patch, follow instructions on the last 2 pages of Patient  ?Logbook. Stick patch monitor onto the last page of Patient Logbook.  ?Place Patient Logbook in the blue and white box. Use locking tab on box and tape box closed  ?securely. The blue and white box has prepaid postage on it. Please place it in the mailbox as  ?soon as possible. Your physician should have your test results approximately 7 days after the  ?monitor has been mailed back to [facility].  ?Call [facility] at [phone] if you have questions regarding  ?your ZIO XT patch monitor. Call them immediately if you see an orange light blinking on your  ?monitor.  ?If your monitor falls off in less than 4 days, contact our Monitor department at [phone].  ?If your monitor becomes loose or falls off after 4 days call [employer] at [phone] for  ?suggestions on securing your monitor  ? ?Important Information About Sugar ? ? ? ? ?  ?

## 2022-01-15 ENCOUNTER — Telehealth: Payer: Self-pay | Admitting: *Deleted

## 2022-01-15 NOTE — Telephone Encounter (Signed)
Prior Authorization for SPLIT NIGHT sent to [employer] via Phone.  ? ?NO PA REQUIRED PER RACHEL U. 01/15/22 ?

## 2022-01-23 NOTE — Progress Notes (Signed)
Pt has been made aware of normal result and verbalized understanding.  jw

## 2022-01-31 ENCOUNTER — Ambulatory Visit (HOSPITAL_COMMUNITY): Payer: PRIVATE HEALTH INSURANCE | Attending: Cardiology

## 2022-01-31 ENCOUNTER — Other Ambulatory Visit (HOSPITAL_COMMUNITY): Payer: PRIVATE HEALTH INSURANCE

## 2022-01-31 DIAGNOSIS — R002 Palpitations: Secondary | ICD-10-CM | POA: Diagnosis not present

## 2022-01-31 DIAGNOSIS — I340 Nonrheumatic mitral (valve) insufficiency: Secondary | ICD-10-CM | POA: Diagnosis not present

## 2022-01-31 LAB — ECHOCARDIOGRAM COMPLETE
Area-P 1/2: 2.99 cm2
S' Lateral: 2.6 cm

## 2022-02-16 ENCOUNTER — Ambulatory Visit (HOSPITAL_BASED_OUTPATIENT_CLINIC_OR_DEPARTMENT_OTHER): Payer: PRIVATE HEALTH INSURANCE | Attending: Cardiovascular Disease | Admitting: Cardiology

## 2022-02-16 DIAGNOSIS — R400 Somnolence: Secondary | ICD-10-CM | POA: Diagnosis not present

## 2022-02-16 DIAGNOSIS — R002 Palpitations: Secondary | ICD-10-CM | POA: Diagnosis not present

## 2022-02-16 DIAGNOSIS — R0683 Snoring: Secondary | ICD-10-CM | POA: Insufficient documentation

## 2022-02-17 NOTE — Procedures (Signed)
   Patient Name: Christine [name], Christine [name] Date:02/16/2022 Gender: Female D.O.B: 1961/08/16 Age (years): 61 Referring Provider: Burnell Blanks Height (inches): 45 Interpreting Physician: Fransico Him MD, ABSM Weight (lbs): 170 RPSGT: [place], [place] BMI: 28 MRN: [id number] Neck Size: 13.00  CLINICAL INFORMATION Sleep Study Type: NPSG  Indication for sleep study: Snoring, Witnessed Apneas  Epworth Sleepiness Score: 3  SLEEP STUDY TECHNIQUE As per the AASM Manual for the Scoring of Sleep and Associated Events v2.3 (April 2016) with a hypopnea requiring 4% desaturations.  The channels recorded and monitored were frontal, central and occipital EEG, electrooculogram (EOG), submentalis EMG (chin), nasal and oral airflow, thoracic and abdominal wall motion, anterior tibialis EMG, snore microphone, electrocardiogram, and pulse oximetry.  MEDICATIONS Medications self-administered by patient taken the night of the study : N/A  SLEEP ARCHITECTURE The study was initiated at 10:50:14 PM and ended at 4:55:02 AM.  Sleep onset time was 25.3 minutes and the sleep efficiency was 70.9%. The total sleep time was 258.5 minutes.  Stage REM latency was 143.5 minutes.  The patient spent 36.8% of the night in stage N1 sleep, 47.4% in stage N2 sleep, 8.1% in stage N3 and 7.7% in REM.  Alpha intrusion was absent.  Supine sleep was 34.62%.  RESPIRATORY PARAMETERS The overall apnea/hypopnea index (AHI) was 2.6 per hour. There were 0 total apneas, including 0 obstructive, 0 central and 0 mixed apneas. There were 11 hypopneas and 21 RERAs.  The AHI during Stage REM sleep was 6.0 per hour.  AHI while supine was 6.0 per hour.  The mean oxygen saturation was 93.1%. The minimum SpO2 during sleep was 89.0%.  soft snoring was noted during this study.  CARDIAC DATA The 2 lead EKG demonstrated sinus rhythm. The mean heart rate was 68.0 beats per minute. Other EKG findings include: None.  LEG  MOVEMENT DATA The total PLMS were 0 with a resulting PLMS index of 0.0. Associated arousal with leg movement index was 0.0 .  IMPRESSIONS - No significant obstructive sleep apnea occurred during this study (AHI = 2.6/h). - The patient had minimal or no oxygen desaturation during the study (Min O2 = 89.0%) - The patient snored with soft snoring volume. - No cardiac abnormalities were noted during this study. - Clinically significant periodic limb movements did not occur during sleep. No significant associated arousals.  DIAGNOSIS - Normal Study  RECOMMENDATIONS - Avoid alcohol, sedatives and other CNS depressants that may worsen sleep apnea and disrupt normal sleep architecture. - Sleep hygiene should be reviewed to assess factors that may improve sleep quality. - Weight management and regular exercise should be initiated or continued if appropriate.  [Electronically signed] 02/17/2022 09:24 AM  Fransico Him MD, ABSM Diplomate, American Board of Sleep Medicine

## 2022-02-22 NOTE — Progress Notes (Signed)
Cardiology Office Note:    Date:  02/24/2022   ID:  Christine [name], DOB 09-09-1961, MRN [id number]  PCP:  Shon Baton, MD   [facility] Providers Cardiologist:  Lauree Chandler, MD     Referring MD: Shon Baton, MD   Chief Complaint: Follow-up palpitations  History of Present Illness:    Christine [name] is a very pleasant 61 y.o. female with a hx of mitral valve prolapse with mild MR by echo at [facility] in 2018, palpitations  She established care with our group 01/13/2022 with evaluation of palpitations by Dr. Angelena Form.  She reported feeling her heart skipping at night.  Cardiac monitor in 2021 revealed sinus rhythm with PVCs.  Echo in 2018 at [facility] with normal LV function and mild MR.  She snores at night and feels that she may have sleep apnea, some daytime somnolence.  Recently found to have hiatal hernia.   Repeat echo 01/31/2022 revealed normal LVEF 60 to 65%, mild LVH, normal diastolic parameters, no RWMA, normal RV, mild MR. The mitral valve appeared normal in structure.  Cardiac monitor revealed sinus rhythm, rare PACs.  Sleep test was ordered for evaluation of sleep apnea.  Today, she is here alone for follow-up. She reports an improvement in palpitations since starting a supplement for menopause. Notes occasional palpitations whereas previously they were was waking her up during the night. Has only had one occasion when she was awakened with a faster HR recently. She overall feels a significant improvement.  She denies chest pain, shortness of breath, presyncope, syncope, orthopnea, and PND.   Past Medical History:  Diagnosis Date   Chest pain    Hiatal hernia    MVP (mitral valve prolapse)    NO PROBLEMS   Palpitations    PONV (postoperative nausea and vomiting)     Past Surgical History:  Procedure Laterality Date   DIAGNOSTIC LAPAROSCOPY  1997   infertility   DILATATION & CURRETTAGE/HYSTEROSCOPY WITH RESECTOCOPE N/A 02/10/2013   Procedure: DILATATION &  CURETTAGE/HYSTEROSCOPY WITH RESECTOCOPE;  Surgeon: Darlyn Chamber, MD;  Location: [facility];  Service: Gynecology;  Laterality: N/A;   DILATION AND CURETTAGE OF UTERUS  2009   mas at 29mons-vag del but also needed d & c   infertility     ivf treatment   IR GENERIC HISTORICAL  04/29/2016   IR RADIOLOGIST EVAL & MGMT 04/29/2016 [facility]INTER[facility] RAD   LAPAROSCOPIC APPENDECTOMY N/A 12/25/2017   Procedure: APPENDECTOMY LAPAROSCOPIC;  Surgeon: Leighton Ruff, MD;  Location: [place];  Service: General;  Laterality: N/A;   TUBAL LIGATION  2009   WISDOM TOOTH EXTRACTION      Current Medications: Current Meds  Medication Sig   ALPRAZolam (XANAX) 0.25 MG tablet Take 0.125-0.25 mg by mouth daily as needed for sleep or anxiety.    cetirizine (ZYRTEC) 10 MG tablet Take 10 mg by mouth at bedtime as needed for allergies or rhinitis.   Collagen Hydrolysate, Bovine, POWD collagen (bovine)   ibuprofen (ADVIL,MOTRIN) 200 MG tablet Take 400 mg by mouth 2 (two) times daily as needed for headache.      Allergies:   Other, Neosporin [neomycin-bacitracin zn-polymyx], Benzalkonium chloride, and Thimerosal   Social History   Socioeconomic History   Marital status: Married    Spouse name: Not on file   Number of children: Not on file   Years of education: Not on file   Highest education level: Not on file  Occupational History   Occupation: Works at [employer]  Tobacco Use   Smoking status: Never   Smokeless tobacco: Never  Substance and Sexual Activity   Alcohol use: Yes    Comment: SOCIALLY   Drug use: No   Sexual activity: Yes    Birth control/protection: Surgical  Other Topics Concern   Not on file  Social History Narrative   Not on file   Social Determinants of Health   Financial Resource Strain: Not on file  Food Insecurity: Not on file  Transportation Needs: Not on file  Physical Activity: Not on file  Stress: Not on file  Social Connections: Not on file     Family History: The patient's family  history includes CAD in her mother; Cancer - Colon in her father.  ROS:   Please see the history of present illness.  All other systems reviewed and are negative.  Labs/Other Studies Reviewed:    The following studies were reviewed today:  Echo 01/31/22   1. Left ventricular ejection fraction, by estimation, is 60 to 65%. The  left ventricle has normal function. The left ventricle has no regional  wall motion abnormalities. There is mild left ventricular hypertrophy.  Left ventricular diastolic parameters  were normal.   2. Right ventricular systolic function is normal. The right ventricular  size is normal. There is normal pulmonary artery systolic pressure. The  estimated right ventricular systolic pressure is [id number] mmHg.   3. A small pericardial effusion is present.   4. The mitral valve is normal in structure. Mild mitral valve  regurgitation. No evidence of mitral stenosis.   5. The aortic valve is tricuspid. Aortic valve regurgitation is not  visualized. No aortic stenosis is present.   6. The inferior vena cava is normal in size with greater than 50%  respiratory variability, suggesting right atrial pressure of 3 mmHg.   Cardiac monitor 01/22/22  Sinus rhythm. (Patient had a min HR of 53 bpm, max HR of 132 bpm, and avg HR of 76 bpm).  Rare premature atrial contractions.    Recent Labs: No results found for requested labs within last 8760 hours.  Recent Lipid Panel No results found for: CHOL, TRIG, HDL, CHOLHDL, VLDL, LDLCALC, LDLDIRECT   Risk Assessment/Calculations:       Physical Exam:    VS:  BP 110/70 (BP Location: Left Arm, Patient Position: Sitting, Cuff Size: Normal)   Pulse 72   Ht 5\' 5"  (1.651 m)   Wt 169 lb (76.7 kg)   SpO2 97%   BMI 28.12 kg/m     Wt Readings from Last 3 Encounters:  02/24/22 169 lb (76.7 kg)  02/16/22 170 lb (77.1 kg)  01/13/22 171 lb 9.6 oz (77.8 kg)     GEN:  Well nourished, well developed in no acute distress HEENT:  Normal NECK: No JVD; No carotid bruits CARDIAC: RRR, no murmurs, rubs, gallops RESPIRATORY:  Clear to auscultation without rales, wheezing or rhonchi  ABDOMEN: Soft, non-tender, non-distended MUSCULOSKELETAL:  No edema; No deformity. 2+ pedal pulses, equal bilaterally SKIN: Warm and dry NEUROLOGIC:  Alert and oriented x 3 PSYCHIATRIC:  Normal affect   EKG:  EKG is not ordered today.    Diagnoses:    1. Pure hypercholesterolemia   2. Palpitations   3. Somnolence   4. Nonrheumatic mitral valve regurgitation    Assessment and Plan:     Palpitations: Symptoms have improved. Cardiac monitor revealed sinus rhythm with rare PACs.  We discussed a prescription for low-dose propranolol to take as needed.  She  does not feel that she needs medication at this time but will keep it in mind for the future.  Mitral regurgitation: Previous report of MVP, however mitral valve appears normal by echo on 01/31/2022.  Mild mitral regurgitation. She is asymptomatic. We can recheck echo in about 3-5 years, sooner if clinically indicated.   Sleep apnea: Suspected.  She completed the in lab sleep test and is awaiting results.  I will send a note to our sleep team for follow-up.  Pure hypercholesteremia: LDL 155 on 08/06/21.  We discussed the importance of LDL goal less than 100 for overall reduction in cardiovascular risk.  She would like to get a CT coronary calcium score.      Disposition: 1 year with Dr. Angelena Form  Medication Adjustments/Labs and Tests Ordered: Current medicines are reviewed at length with the patient today.  Concerns regarding medicines are outlined above.  Orders Placed This Encounter  Procedures   CT CARDIAC SCORING (SELF PAY ONLY)   No orders of the defined types were placed in this encounter.   Patient Instructions  Medication Instructions:  Your physician recommends that you continue on your current medications as directed. Please refer to the Current Medication list given  to you today.` *If you need a refill on your cardiac medications before your next appointment, please call your pharmacy*   Lab Work: None ordered If you have labs (blood work) drawn today and your tests are completely normal, you will receive your results only by: [place] (if you have MyChart) OR A paper copy in the mail If you have any lab test that is abnormal or we need to change your treatment, we will call you to review the results.   Testing/Procedures: Your provider would like for you to have a Coronary Calcium Score test. This test is measures the amount of calcified plaque you have in those arteries, which is important because coronary plaque is the main underlying cause of -- or precursor to -- atherosclerotic cardiovascular disease (ASCVD) events such as heart attacks and strokes. This test costs $99 and is not covered by insurance. This test will be performed at [place]: At [facility], you and your health needs are our priority.  As part of our continuing mission to provide you with exceptional heart care, we have created designated Provider Care Teams.  These Care Teams include your primary Cardiologist (physician) and Advanced Practice Providers (APPs -  Physician Assistants and Nurse Practitioners) who all work together to provide you with the care you need, when you need it.  We recommend signing up for the patient portal called "MyChart".  Sign up information is provided on this After Visit Summary.  MyChart is used to connect with patients for Virtual Visits (Telemedicine).  Patients are able to view lab/test results, encounter notes, upcoming appointments, etc.  Non-urgent messages can be sent to your provider as well.   To learn more about what you can do with MyChart, go to [email or web address].    Your next appointment:   1 year(s)  The format for your next appointment:   In Person  Provider:   Lauree Chandler, MD      Other Instructions   Important Information About Sugar         Signed, Emmaline Life, NP  02/24/2022 12:14 PM    [place]

## 2022-02-24 ENCOUNTER — Encounter: Payer: Self-pay | Admitting: Nurse Practitioner

## 2022-02-24 ENCOUNTER — Ambulatory Visit (INDEPENDENT_AMBULATORY_CARE_PROVIDER_SITE_OTHER): Payer: PRIVATE HEALTH INSURANCE | Admitting: Nurse Practitioner

## 2022-02-24 VITALS — BP 110/70 | HR 72 | Ht 65.0 in | Wt 169.0 lb

## 2022-02-24 DIAGNOSIS — E7800 Pure hypercholesterolemia, unspecified: Secondary | ICD-10-CM

## 2022-02-24 DIAGNOSIS — R400 Somnolence: Secondary | ICD-10-CM

## 2022-02-24 DIAGNOSIS — I340 Nonrheumatic mitral (valve) insufficiency: Secondary | ICD-10-CM | POA: Diagnosis not present

## 2022-02-24 DIAGNOSIS — R002 Palpitations: Secondary | ICD-10-CM | POA: Diagnosis not present

## 2022-02-24 NOTE — Patient Instructions (Signed)
Medication Instructions:  Your physician recommends that you continue on your current medications as directed. Please refer to the Current Medication list given to you today.` *If you need a refill on your cardiac medications before your next appointment, please call your pharmacy*   Lab Work: None ordered If you have labs (blood work) drawn today and your tests are completely normal, you will receive your results only by: MyChart Message (if you have MyChart) OR A paper copy in the mail If you have any lab test that is abnormal or we need to change your treatment, we will call you to review the results.   Testing/Procedures: Your provider would like for you to have a Coronary Calcium Score test. This test is measures the amount of calcified plaque you have in those arteries, which is important because coronary plaque is the main underlying cause of -- or precursor to -- atherosclerotic cardiovascular disease (ASCVD) events such as heart attacks and strokes. This test costs $99 and is not covered by insurance. This test will be performed at [facility].   Follow-Up: At [facility], you and your health needs are our priority.  As part of our continuing mission to provide you with exceptional heart care, we have created designated Provider Care Teams.  These Care Teams include your primary Cardiologist (physician) and Advanced Practice Providers (APPs -  Physician Assistants and Nurse Practitioners) who all work together to provide you with the care you need, when you need it.  We recommend signing up for the patient portal called "MyChart".  Sign up information is provided on this After Visit Summary.  MyChart is used to connect with patients for Virtual Visits (Telemedicine).  Patients are able to view lab/test results, encounter notes, upcoming appointments, etc.  Non-urgent messages can be sent to your provider as well.   To learn more about what you can do with MyChart, go to  [email or web address].    Your next appointment:   1 year(s)  The format for your next appointment:   In Person  Provider:   Verne Carrow, MD     Other Instructions   Important Information About Sugar

## 2022-02-25 ENCOUNTER — Telehealth: Payer: Self-pay | Admitting: *Deleted

## 2022-02-25 NOTE — Telephone Encounter (Signed)
-----   Message from Lauralee Evener, [place] sent at 02/18/2022  8:21 AM EDT -----  ----- Message ----- From: Sueanne Margarita, MD Sent: 02/17/2022   9:26 AM EDT To: Cv Div Sleep Studies  Please let patient know that sleep study showed no significant sleep apnea.

## 2022-02-25 NOTE — Telephone Encounter (Signed)
The patient has been notified of the result via her mychart. Also left detailed message on voicemail and informed patient to call back with questions.Marolyn Hammock, [place] 01/30/2022 12:08 PM

## 2022-03-10 IMAGING — US US THYROID
1 series · 13 of 25 positions shown · non-contrast
Comparison: 11/12/2018

CLINICAL DATA: 59-year-old female with a history of thyroid nodules

EXAM:
THYROID ULTRASOUND
TECHNIQUE: Ultrasound examination of the thyroid gland and adjacent soft
tissues was performed.

[Series 1: us thyroid · 0.08mm/px · 63 acquisitions, 13 frames shown]
[im 1/63]
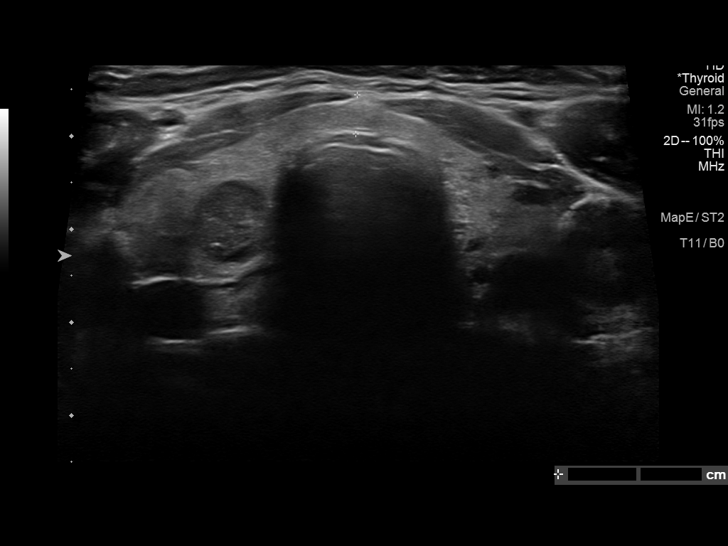
[im 6/63]
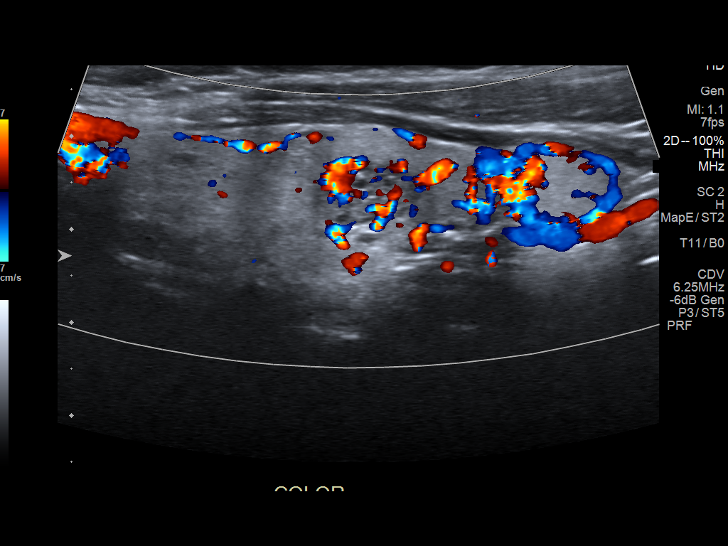
[im 11/63]
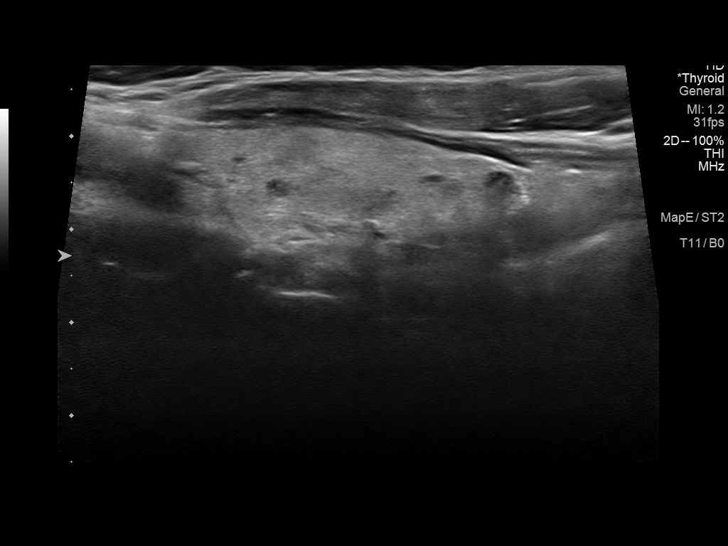
[im 16/63]
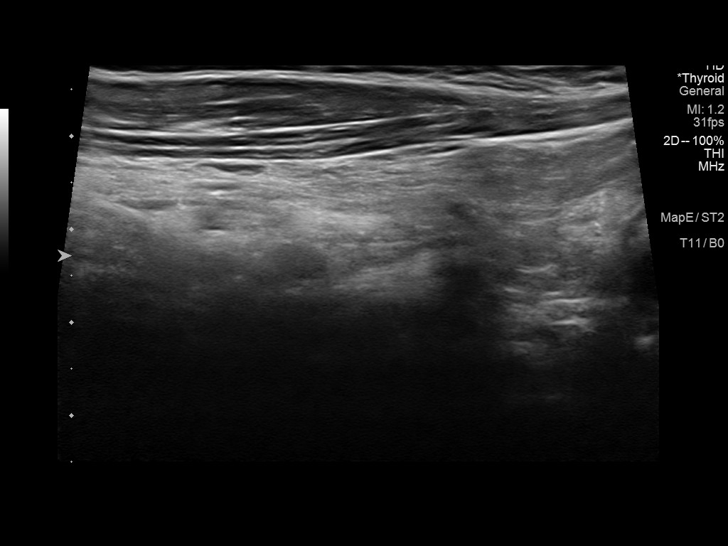
[im 21/63]
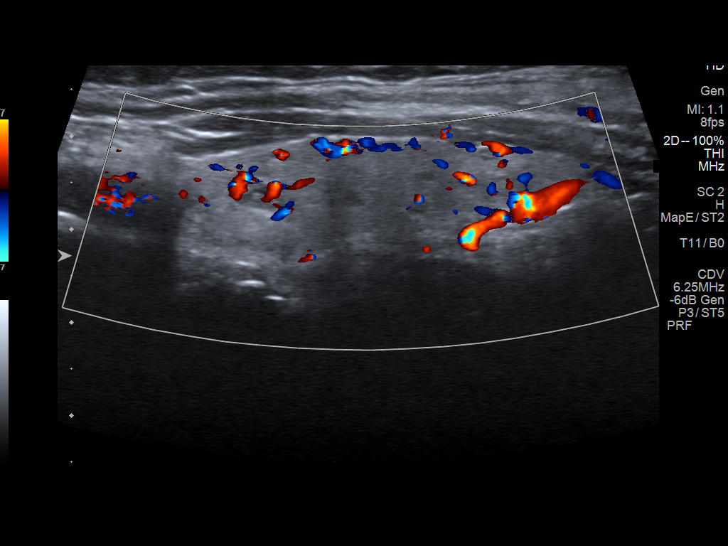
[im 26/63]
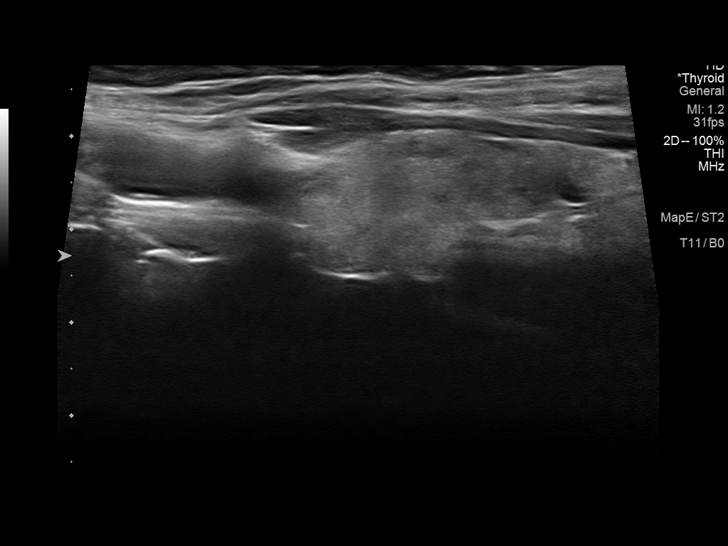
[im 32/63]
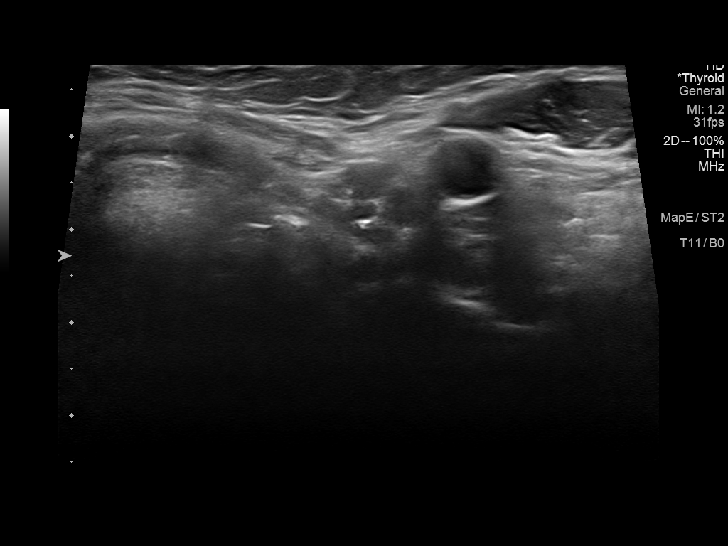
[im 37/63]
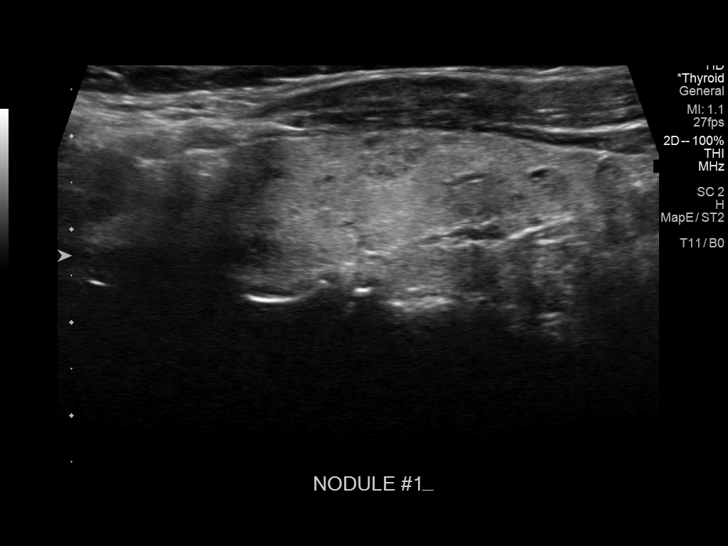
[im 42/63]
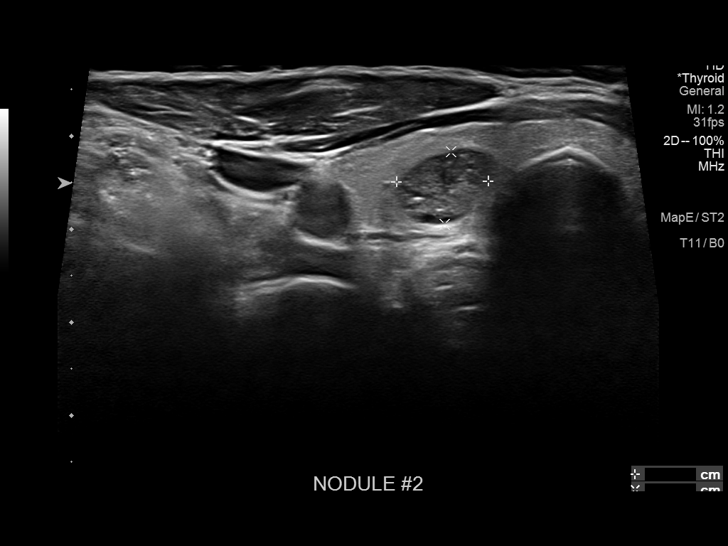
[im 47/63]
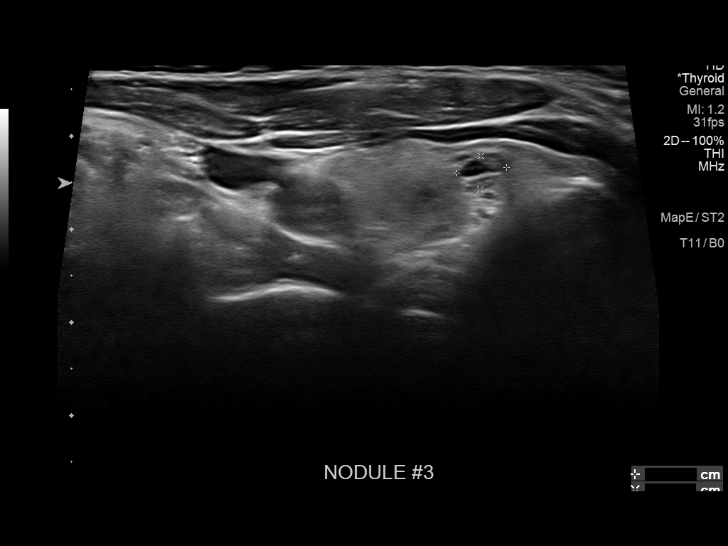
[im 52/63]
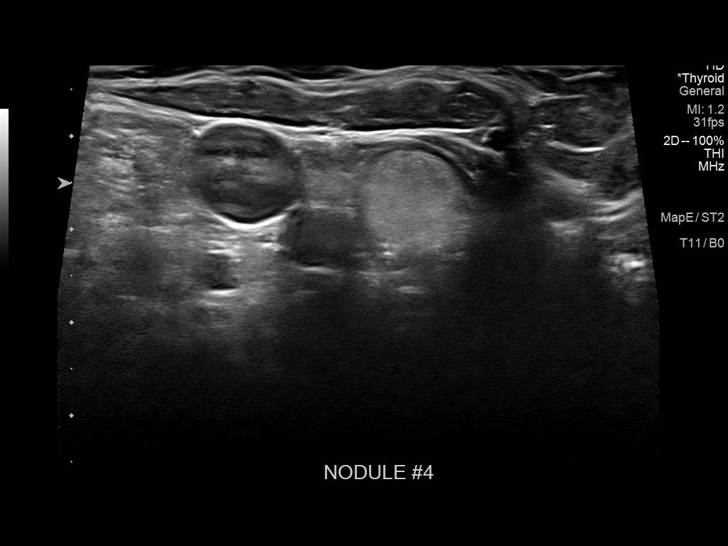
[im 57/63]
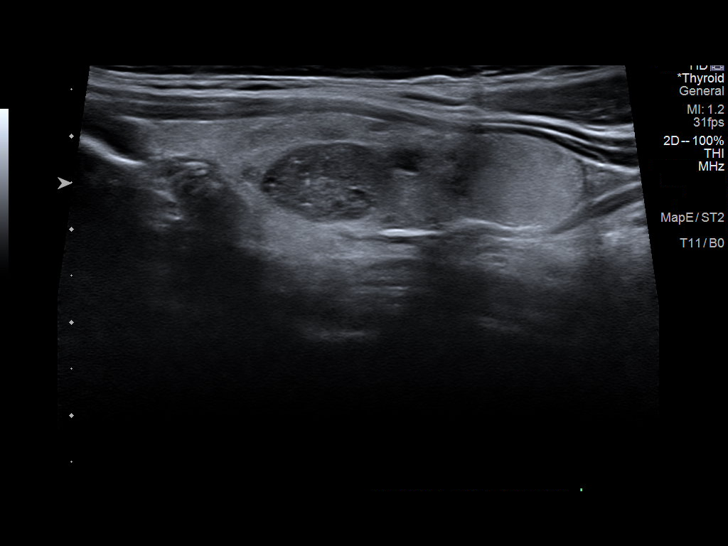
[im 63/63]
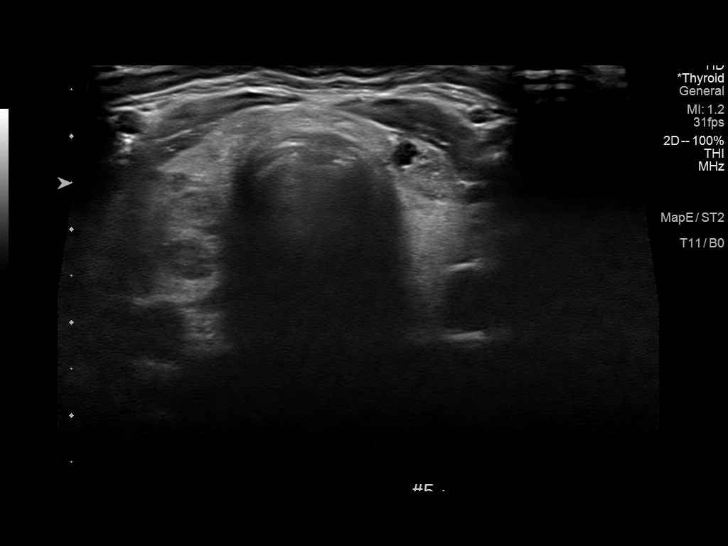

[13 of 25 positions shown; findings below may reference images not displayed]

FINDINGS: Parenchymal Echotexture: Mildly heterogenous

Isthmus: 0.4 cm

Right lobe: 5.5 cm x 1.6 cm x 1.9 cm

Left lobe: 4.5 cm x 1.2 cm x 1.1 cm

_________________________________________________________

Estimated total number of nodules >/= 1 cm: 3

Number of spongiform nodules >/=  2 cm not described below (TR1): 0

Number of mixed cystic and solid nodules >/= 1.5 cm not described
below (TR2): 0

_________________________________________________________

Nodule # 1:

Location: Right; Mid

Maximum size: 0.8 cm; Other 2 dimensions: 0.6 cm x 0.5 cm

Composition: spongiform (0)

ACR TI-RADS recommendations:

Spongiform nodule does not meet criteria for surveillance or biopsy

_________________________________________________________

Nodule labeled 2 in the right mid thyroid, essentially unchanged
measuring 1.4 cm, previously 1.3 cm. The nodule appears spongiform
on the current ultrasound and does not meet criteria for
surveillance or biopsy.

Nodule labeled 3 in the right mid thyroid, 6 mm, partially cystic,
TR 2, and does not meet criteria for surveillance or biopsy.

Nodule labeled 4 inferior right thyroid, 1.4 cm, unchanged. This
remains TR 3 and does not meet criteria for surveillance or biopsy.

Nodule labeled 5, left thyroid, 0.9 cm. This remains below the
threshold for any further surveillance or biopsy.

No adenopathy
IMPRESSION: Multinodular thyroid again demonstrated.

Right mid thyroid nodule (previously labeled 4, currently labeled 2)
is clearly spongiform on the current ultrasound, and does not meet
criteria for further surveillance.

No other thyroid nodule meets criteria for biopsy or surveillance,
as designated by the newly established ACR TI-RADS criteria.

Recommendations follow those established by the new ACR TI-RADS
criteria ([HOSPITAL] 0859;[DATE]).

## 2022-04-24 ENCOUNTER — Ambulatory Visit (HOSPITAL_BASED_OUTPATIENT_CLINIC_OR_DEPARTMENT_OTHER)
Admission: RE | Admit: 2022-04-24 | Discharge: 2022-04-24 | Disposition: A | Discharge status: Home / Self Care | Payer: PRIVATE HEALTH INSURANCE | Source: Ambulatory Visit | Attending: Nurse Practitioner | Admitting: Nurse Practitioner

## 2022-04-24 DIAGNOSIS — E7800 Pure hypercholesterolemia, unspecified: Secondary | ICD-10-CM | POA: Insufficient documentation
